# Patient Record
Sex: Female | Born: 1960 | Race: White | Hispanic: No | Marital: Married | Smoking: Never smoker
Health system: Southern US, Community
[De-identification: ages and names within clinical notes are randomized; demographics above are authoritative.]

---

## 1991-01-13 DIAGNOSIS — M25569 Pain in unspecified knee: Secondary | ICD-10-CM

## 1997-05-10 ENCOUNTER — Other Ambulatory Visit: Admission: RE | Admit: 1997-05-10 | Discharge: 1997-05-10 | Payer: Self-pay | Admitting: Obstetrics & Gynecology

## 1998-03-28 ENCOUNTER — Other Ambulatory Visit: Admission: RE | Admit: 1998-03-28 | Discharge: 1998-03-28 | Payer: Self-pay | Admitting: Obstetrics & Gynecology

## 1998-12-30 ENCOUNTER — Ambulatory Visit (HOSPITAL_COMMUNITY): Admission: RE | Admit: 1998-12-30 | Discharge: 1998-12-30 | Payer: Self-pay | Admitting: Specialist

## 1998-12-30 ENCOUNTER — Encounter: Payer: Self-pay | Admitting: Specialist

## 1999-01-22 ENCOUNTER — Ambulatory Visit (HOSPITAL_COMMUNITY): Admission: RE | Admit: 1999-01-22 | Discharge: 1999-01-22 | Payer: Self-pay | Admitting: Specialist

## 1999-01-22 ENCOUNTER — Encounter: Payer: Self-pay | Admitting: Specialist

## 1999-04-01 ENCOUNTER — Other Ambulatory Visit: Admission: RE | Admit: 1999-04-01 | Discharge: 1999-04-01 | Payer: Self-pay | Admitting: Obstetrics & Gynecology

## 1999-09-10 ENCOUNTER — Other Ambulatory Visit: Admission: RE | Admit: 1999-09-10 | Discharge: 1999-09-10 | Payer: Self-pay | Admitting: Obstetrics & Gynecology

## 1999-09-10 ENCOUNTER — Encounter (INDEPENDENT_AMBULATORY_CARE_PROVIDER_SITE_OTHER): Payer: Self-pay

## 2000-03-08 ENCOUNTER — Other Ambulatory Visit: Admission: RE | Admit: 2000-03-08 | Discharge: 2000-03-08 | Payer: Self-pay | Admitting: Obstetrics & Gynecology

## 2001-03-28 ENCOUNTER — Other Ambulatory Visit: Admission: RE | Admit: 2001-03-28 | Discharge: 2001-03-28 | Payer: Self-pay | Admitting: Obstetrics & Gynecology

## 2001-12-19 ENCOUNTER — Encounter: Payer: Self-pay | Admitting: Obstetrics & Gynecology

## 2001-12-19 ENCOUNTER — Encounter: Admission: RE | Admit: 2001-12-19 | Discharge: 2001-12-19 | Payer: Self-pay | Admitting: Obstetrics & Gynecology

## 2002-04-03 ENCOUNTER — Other Ambulatory Visit: Admission: RE | Admit: 2002-04-03 | Discharge: 2002-04-03 | Payer: Self-pay | Admitting: Obstetrics & Gynecology

## 2002-05-11 ENCOUNTER — Encounter: Payer: Self-pay | Admitting: Obstetrics & Gynecology

## 2002-05-11 ENCOUNTER — Encounter: Admission: RE | Admit: 2002-05-11 | Discharge: 2002-05-11 | Payer: Self-pay | Admitting: Obstetrics & Gynecology

## 2003-04-10 ENCOUNTER — Other Ambulatory Visit: Admission: RE | Admit: 2003-04-10 | Discharge: 2003-04-10 | Payer: Self-pay | Admitting: Obstetrics & Gynecology

## 2003-06-12 ENCOUNTER — Encounter: Admission: RE | Admit: 2003-06-12 | Discharge: 2003-06-12 | Payer: Self-pay | Admitting: Obstetrics & Gynecology

## 2004-04-11 ENCOUNTER — Other Ambulatory Visit: Admission: RE | Admit: 2004-04-11 | Discharge: 2004-04-11 | Payer: Self-pay | Admitting: Obstetrics & Gynecology

## 2004-08-18 ENCOUNTER — Encounter: Admission: RE | Admit: 2004-08-18 | Discharge: 2004-08-18 | Payer: Self-pay | Admitting: Obstetrics & Gynecology

## 2004-08-28 ENCOUNTER — Encounter: Admission: RE | Admit: 2004-08-28 | Discharge: 2004-08-28 | Payer: Self-pay | Admitting: Obstetrics & Gynecology

## 2005-10-07 ENCOUNTER — Encounter: Admission: RE | Admit: 2005-10-07 | Discharge: 2005-10-07 | Payer: Self-pay | Admitting: Obstetrics & Gynecology

## 2006-10-07 ENCOUNTER — Encounter: Admission: RE | Admit: 2006-10-07 | Discharge: 2006-10-07 | Payer: Self-pay | Admitting: Obstetrics & Gynecology

## 2007-11-29 ENCOUNTER — Encounter: Admission: RE | Admit: 2007-11-29 | Discharge: 2007-11-29 | Payer: Self-pay | Admitting: Obstetrics & Gynecology

## 2009-03-12 ENCOUNTER — Encounter: Admission: RE | Admit: 2009-03-12 | Discharge: 2009-03-12 | Payer: Self-pay | Admitting: Obstetrics & Gynecology

## 2010-02-02 ENCOUNTER — Encounter: Payer: Self-pay | Admitting: Obstetrics & Gynecology

## 2010-02-05 ENCOUNTER — Other Ambulatory Visit: Payer: Self-pay | Admitting: Obstetrics & Gynecology

## 2010-02-05 DIAGNOSIS — Z1239 Encounter for other screening for malignant neoplasm of breast: Secondary | ICD-10-CM

## 2010-03-17 ENCOUNTER — Ambulatory Visit: Payer: Self-pay

## 2010-03-25 ENCOUNTER — Ambulatory Visit: Payer: Self-pay

## 2010-04-15 ENCOUNTER — Other Ambulatory Visit: Payer: Self-pay | Admitting: Obstetrics & Gynecology

## 2010-04-15 ENCOUNTER — Other Ambulatory Visit: Payer: Self-pay | Admitting: Occupational Therapy

## 2010-04-15 ENCOUNTER — Ambulatory Visit
Admission: RE | Admit: 2010-04-15 | Discharge: 2010-04-15 | Disposition: A | Discharge status: Home / Self Care | Payer: 59 | Source: Ambulatory Visit | Attending: Obstetrics & Gynecology | Admitting: Obstetrics & Gynecology

## 2010-04-15 DIAGNOSIS — R928 Other abnormal and inconclusive findings on diagnostic imaging of breast: Secondary | ICD-10-CM

## 2010-04-15 DIAGNOSIS — Z1239 Encounter for other screening for malignant neoplasm of breast: Secondary | ICD-10-CM

## 2010-04-16 ENCOUNTER — Ambulatory Visit
Admission: RE | Admit: 2010-04-16 | Discharge: 2010-04-16 | Disposition: A | Discharge status: Home / Self Care | Payer: 59 | Source: Ambulatory Visit | Attending: Obstetrics & Gynecology | Admitting: Obstetrics & Gynecology

## 2010-04-16 DIAGNOSIS — R928 Other abnormal and inconclusive findings on diagnostic imaging of breast: Secondary | ICD-10-CM

## 2011-03-30 ENCOUNTER — Other Ambulatory Visit: Payer: Self-pay | Admitting: Obstetrics & Gynecology

## 2011-03-30 DIAGNOSIS — Z1231 Encounter for screening mammogram for malignant neoplasm of breast: Secondary | ICD-10-CM

## 2011-04-20 ENCOUNTER — Ambulatory Visit: Payer: 59

## 2011-04-29 ENCOUNTER — Ambulatory Visit
Admission: RE | Admit: 2011-04-29 | Discharge: 2011-04-29 | Disposition: A | Discharge status: Home / Self Care | Payer: 59 | Source: Ambulatory Visit | Attending: Obstetrics & Gynecology | Admitting: Obstetrics & Gynecology

## 2011-04-29 DIAGNOSIS — Z1231 Encounter for screening mammogram for malignant neoplasm of breast: Secondary | ICD-10-CM

## 2012-05-10 ENCOUNTER — Other Ambulatory Visit: Payer: Self-pay

## 2012-05-10 DIAGNOSIS — Z1231 Encounter for screening mammogram for malignant neoplasm of breast: Secondary | ICD-10-CM

## 2012-06-01 ENCOUNTER — Ambulatory Visit
Admission: RE | Admit: 2012-06-01 | Discharge: 2012-06-01 | Disposition: A | Discharge status: Home / Self Care | Payer: 59 | Source: Ambulatory Visit

## 2012-06-01 DIAGNOSIS — Z1231 Encounter for screening mammogram for malignant neoplasm of breast: Secondary | ICD-10-CM

## 2013-04-25 ENCOUNTER — Other Ambulatory Visit: Payer: Self-pay

## 2013-04-25 ENCOUNTER — Other Ambulatory Visit: Payer: Self-pay | Admitting: Obstetrics & Gynecology

## 2013-04-25 DIAGNOSIS — Z1231 Encounter for screening mammogram for malignant neoplasm of breast: Secondary | ICD-10-CM

## 2013-06-07 ENCOUNTER — Ambulatory Visit
Admission: RE | Admit: 2013-06-07 | Discharge: 2013-06-07 | Disposition: A | Discharge status: Home / Self Care | Payer: 59 | Source: Ambulatory Visit

## 2013-06-07 DIAGNOSIS — Z1231 Encounter for screening mammogram for malignant neoplasm of breast: Secondary | ICD-10-CM

## 2014-05-08 ENCOUNTER — Other Ambulatory Visit: Payer: Self-pay

## 2014-05-08 DIAGNOSIS — Z1231 Encounter for screening mammogram for malignant neoplasm of breast: Secondary | ICD-10-CM

## 2014-06-25 ENCOUNTER — Ambulatory Visit: Payer: Self-pay

## 2014-07-06 ENCOUNTER — Ambulatory Visit (INDEPENDENT_AMBULATORY_CARE_PROVIDER_SITE_OTHER): Payer: 59 | Admitting: Podiatry

## 2014-07-06 DIAGNOSIS — M2040 Other hammer toe(s) (acquired), unspecified foot: Secondary | ICD-10-CM | POA: Diagnosis not present

## 2014-07-06 DIAGNOSIS — B351 Tinea unguium: Secondary | ICD-10-CM | POA: Diagnosis not present

## 2014-07-06 MED ORDER — TERBINAFINE HCL 250 MG PO TABS: ORAL_TABLET | ORAL | Status: DC

## 2014-07-06 NOTE — Progress Notes (Signed)
   Subjective:    Patient ID: Laura Tapia, female    DOB: 07/19/1960, 54 y.o.   MRN: [id number]  HPI Pt presents with b/l thickening of nails, right great, left 4th   Review of Systems  All other systems reviewed and are negative.      Objective:   Physical Exam        Assessment & Plan:

## 2014-07-06 NOTE — Progress Notes (Signed)
Subjective:     Patient ID: Laura Tapia, female   DOB: 04-25-1960, 54 y.o.   MRN: [id number]  HPI patient presents stating I have some nails that have been thick and become ingrown and also I have a digital deformity on my left foot that is becoming more prominent as time goes on   Review of Systems  All other systems reviewed and are negative.      Objective:   Physical Exam  Constitutional: She is oriented to person, place, and time.  Cardiovascular: Intact distal pulses.   Musculoskeletal: Normal range of motion.  Neurological: She is oriented to person, place, and time.  Skin: Skin is warm.  Nursing note and vitals reviewed.  neurovascular status intact muscle strength adequate with range of motion within normal limits. Patient is noted to have discoloration in thickness of the hallux nails right over left and the fourth nail left with change noted especially the fourth nail left. Is noted to have elevation of the lesser digits left over right with moderate rigid contracture of the second MPJ     Assessment:     Combination of organic fungal infection along with localized trauma and possible opportunistic fungal infection of 3 toenails. Also noted to have hammertoe deformity second left    Plan:     H&P and conditions reviewed with Marchelle Folks. Today were to go ahead start her on pulse Lamisil treatment and topical formulas 3 with consideration some day for nail removal if symptoms were to worsen or possible laser treatment. Educated her on all these different elements of this condition and reappoint to recheck

## 2014-07-11 ENCOUNTER — Ambulatory Visit
Admission: RE | Admit: 2014-07-11 | Discharge: 2014-07-11 | Disposition: A | Discharge status: Home / Self Care | Payer: 59 | Source: Ambulatory Visit

## 2014-07-11 DIAGNOSIS — Z1231 Encounter for screening mammogram for malignant neoplasm of breast: Secondary | ICD-10-CM

## 2015-06-05 ENCOUNTER — Other Ambulatory Visit: Payer: Self-pay

## 2015-06-05 DIAGNOSIS — Z1231 Encounter for screening mammogram for malignant neoplasm of breast: Secondary | ICD-10-CM

## 2015-07-22 ENCOUNTER — Other Ambulatory Visit: Payer: Self-pay | Admitting: Internal Medicine

## 2015-07-22 ENCOUNTER — Ambulatory Visit
Admission: RE | Admit: 2015-07-22 | Discharge: 2015-07-22 | Disposition: A | Discharge status: Home / Self Care | Payer: 59 | Source: Ambulatory Visit

## 2015-07-22 DIAGNOSIS — Z1231 Encounter for screening mammogram for malignant neoplasm of breast: Secondary | ICD-10-CM

## 2015-07-24 ENCOUNTER — Other Ambulatory Visit: Payer: Self-pay | Admitting: Internal Medicine

## 2015-07-24 DIAGNOSIS — R928 Other abnormal and inconclusive findings on diagnostic imaging of breast: Secondary | ICD-10-CM

## 2015-07-30 ENCOUNTER — Ambulatory Visit
Admission: RE | Admit: 2015-07-30 | Discharge: 2015-07-30 | Disposition: A | Discharge status: Home / Self Care | Payer: 59 | Source: Ambulatory Visit | Attending: Internal Medicine | Admitting: Internal Medicine

## 2015-07-30 DIAGNOSIS — R928 Other abnormal and inconclusive findings on diagnostic imaging of breast: Secondary | ICD-10-CM

## 2016-02-04 DIAGNOSIS — M459 Ankylosing spondylitis of unspecified sites in spine: Secondary | ICD-10-CM | POA: Diagnosis not present

## 2016-02-04 DIAGNOSIS — M150 Primary generalized (osteo)arthritis: Secondary | ICD-10-CM | POA: Diagnosis not present

## 2016-02-04 DIAGNOSIS — M2550 Pain in unspecified joint: Secondary | ICD-10-CM | POA: Diagnosis not present

## 2016-02-11 DIAGNOSIS — M47816 Spondylosis without myelopathy or radiculopathy, lumbar region: Secondary | ICD-10-CM | POA: Diagnosis not present

## 2016-02-18 DIAGNOSIS — M6281 Muscle weakness (generalized): Secondary | ICD-10-CM | POA: Diagnosis not present

## 2016-02-18 DIAGNOSIS — M62838 Other muscle spasm: Secondary | ICD-10-CM | POA: Diagnosis not present

## 2016-02-18 DIAGNOSIS — M5430 Sciatica, unspecified side: Secondary | ICD-10-CM | POA: Diagnosis not present

## 2016-02-18 DIAGNOSIS — M5136 Other intervertebral disc degeneration, lumbar region: Secondary | ICD-10-CM | POA: Diagnosis not present

## 2016-02-25 DIAGNOSIS — M6281 Muscle weakness (generalized): Secondary | ICD-10-CM | POA: Diagnosis not present

## 2016-02-25 DIAGNOSIS — M62838 Other muscle spasm: Secondary | ICD-10-CM | POA: Diagnosis not present

## 2016-02-25 DIAGNOSIS — M5430 Sciatica, unspecified side: Secondary | ICD-10-CM | POA: Diagnosis not present

## 2016-03-04 DIAGNOSIS — M62838 Other muscle spasm: Secondary | ICD-10-CM | POA: Diagnosis not present

## 2016-03-04 DIAGNOSIS — M2550 Pain in unspecified joint: Secondary | ICD-10-CM | POA: Diagnosis not present

## 2016-03-04 DIAGNOSIS — M6281 Muscle weakness (generalized): Secondary | ICD-10-CM | POA: Diagnosis not present

## 2016-03-12 DIAGNOSIS — M459 Ankylosing spondylitis of unspecified sites in spine: Secondary | ICD-10-CM | POA: Diagnosis not present

## 2016-03-12 DIAGNOSIS — M62838 Other muscle spasm: Secondary | ICD-10-CM | POA: Diagnosis not present

## 2016-03-12 DIAGNOSIS — M6281 Muscle weakness (generalized): Secondary | ICD-10-CM | POA: Diagnosis not present

## 2016-03-17 DIAGNOSIS — M6281 Muscle weakness (generalized): Secondary | ICD-10-CM | POA: Diagnosis not present

## 2016-03-17 DIAGNOSIS — M2550 Pain in unspecified joint: Secondary | ICD-10-CM | POA: Diagnosis not present

## 2016-03-17 DIAGNOSIS — M62838 Other muscle spasm: Secondary | ICD-10-CM | POA: Diagnosis not present

## 2016-03-24 DIAGNOSIS — M533 Sacrococcygeal disorders, not elsewhere classified: Secondary | ICD-10-CM | POA: Diagnosis not present

## 2016-03-31 DIAGNOSIS — E039 Hypothyroidism, unspecified: Secondary | ICD-10-CM | POA: Diagnosis not present

## 2016-04-03 DIAGNOSIS — M76822 Posterior tibial tendinitis, left leg: Secondary | ICD-10-CM | POA: Diagnosis not present

## 2016-04-03 DIAGNOSIS — M25572 Pain in left ankle and joints of left foot: Secondary | ICD-10-CM | POA: Diagnosis not present

## 2016-04-07 DIAGNOSIS — L814 Other melanin hyperpigmentation: Secondary | ICD-10-CM | POA: Diagnosis not present

## 2016-04-14 DIAGNOSIS — M459 Ankylosing spondylitis of unspecified sites in spine: Secondary | ICD-10-CM | POA: Diagnosis not present

## 2016-04-14 DIAGNOSIS — M150 Primary generalized (osteo)arthritis: Secondary | ICD-10-CM | POA: Diagnosis not present

## 2016-04-14 DIAGNOSIS — M2550 Pain in unspecified joint: Secondary | ICD-10-CM | POA: Diagnosis not present

## 2016-04-28 DIAGNOSIS — M76822 Posterior tibial tendinitis, left leg: Secondary | ICD-10-CM | POA: Diagnosis not present

## 2016-05-11 DIAGNOSIS — M47816 Spondylosis without myelopathy or radiculopathy, lumbar region: Secondary | ICD-10-CM | POA: Diagnosis not present

## 2016-05-20 DIAGNOSIS — M76822 Posterior tibial tendinitis, left leg: Secondary | ICD-10-CM | POA: Diagnosis not present

## 2016-05-26 DIAGNOSIS — Z008 Encounter for other general examination: Secondary | ICD-10-CM | POA: Diagnosis not present

## 2016-05-26 DIAGNOSIS — Z01419 Encounter for gynecological examination (general) (routine) without abnormal findings: Secondary | ICD-10-CM | POA: Diagnosis not present

## 2016-05-26 DIAGNOSIS — M5416 Radiculopathy, lumbar region: Secondary | ICD-10-CM | POA: Diagnosis not present

## 2016-05-28 ENCOUNTER — Telehealth (HOSPITAL_COMMUNITY): Payer: Self-pay

## 2016-05-28 NOTE — Telephone Encounter (Signed)
Pt is not available she has to work on this date

## 2016-06-09 ENCOUNTER — Ambulatory Visit (HOSPITAL_COMMUNITY): Payer: 59 | Attending: Orthopedic Surgery | Admitting: Physical Therapy

## 2016-06-09 ENCOUNTER — Encounter (HOSPITAL_COMMUNITY): Payer: Self-pay | Admitting: Physical Therapy

## 2016-06-09 DIAGNOSIS — R2689 Other abnormalities of gait and mobility: Secondary | ICD-10-CM | POA: Diagnosis not present

## 2016-06-09 DIAGNOSIS — M79672 Pain in left foot: Secondary | ICD-10-CM | POA: Insufficient documentation

## 2016-06-09 DIAGNOSIS — M6281 Muscle weakness (generalized): Secondary | ICD-10-CM | POA: Insufficient documentation

## 2016-06-09 NOTE — Therapy (Signed)
[place] [facility] [place] [address] [place], [place], [address] Phone: [phone]   Fax:  [phone]  Physical Therapy Evaluation  Patient Details  Name: Laura Tapia MRN: [id number] Date of Birth: 1960/01/18 Referring Provider: Toni Arthurs, MD   Encounter Date: 06/09/2016      PT End of Session - 06/09/16 1220    Visit Number 1   Number of Visits 13   Date for PT Re-Evaluation 06/30/16   Authorization Type UHC    Authorization Time Period 06/09/16 to 07/21/16   PT Start Time 0817   PT Stop Time 0858   PT Time Calculation (min) 41 min   Activity Tolerance Patient tolerated treatment well;No increased pain   Behavior During Therapy [facility] for tasks assessed/performed      History reviewed. No pertinent past medical history.  History reviewed. No pertinent surgical history.  There were no vitals filed for this visit.       Subjective Assessment - 06/09/16 0821    Subjective Pt reports pain began last October out of nowhere. She works on her feet 9 hours a day. Over the past several months she was trying various conservative treatments such as injections and orthotics. She is currently wearing an ankle brace which she thinks might be helping some.    Pertinent History Being treated for RA but not any treatment    Limitations Walking;Standing   How long can you sit comfortably? unlimited    How long can you stand comfortably? couple of hours    How long can you walk comfortably? couple of hours    Diagnostic tests MRI and foot X-ray negative    Patient Stated Goals improve foot pain    Currently in Pain? No/denies   Pain Location Foot   Pain Orientation Medial   Pain Descriptors / Indicators Aching;Throbbing   Pain Type Chronic pain   Pain Radiating Towards none    Pain Onset More than a month ago   Pain Frequency Intermittent   Aggravating Factors  being up on her feet for long; pointing toes    Pain Relieving Factors compression,  ice and being off of her feet             [facility] PT Assessment - 06/09/16 0001      Assessment   Medical Diagnosis posterior tibial tendonitis    Referring Provider Toni Arthurs, MD    Onset Date/Surgical Date --  Oct 2017   Next MD Visit ~3 months    Prior Therapy none      Balance Screen   Has the patient fallen in the past 6 months No   Has the patient had a decrease in activity level because of a fear of falling?  No   Is the patient reluctant to leave their home because of a fear of falling?  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      Cognition   Overall Cognitive Status Within Functional Limits for tasks assessed     Observation/Other Assessments   Focus on Therapeutic Outcomes (FOTO)  27% limited      ROM / Strength   AROM / PROM / Strength AROM;Strength     AROM   AROM Assessment Site Ankle   Right/Left Ankle Right;Left   Right Ankle Dorsiflexion 2  Knee flexed 10 deg   Right Ankle Plantar Flexion 54   Right Ankle Inversion 12   Left Ankle Dorsiflexion 8  knee flexed 15 deg    Left Ankle Plantar Flexion 52   Left Ankle Inversion 12     Strength   Strength Assessment Site Hip;Knee;Ankle   Right/Left Hip Right;Left   Right Hip Flexion 5/5   Right Hip Extension 4/5   Right Hip ABduction 4-/5   Left Hip Flexion 5/5   Left Hip Extension 4/5   Left Hip ABduction 4/5   Right/Left Knee Right;Left   Right Knee Flexion 4+/5   Right Knee Extension 5/5   Left Knee Flexion 4+/5   Left Knee Extension 5/5   Right/Left Ankle Right;Left   Right Ankle Dorsiflexion 5/5   Right Ankle Plantar Flexion 5/5   Right Ankle Inversion 5/5   Right Ankle Eversion 5/5   Left Ankle Dorsiflexion 5/5   Left Ankle Plantar Flexion --  (+) pain after 9 reps   Left Ankle Inversion 4/5   Left Ankle Eversion 5/5     Palpation   Palpation  comment tenderness along medial navicular region along the posterior tibial tendon      Ambulation/Gait   Gait Comments (+) pronation on the Lt, decreased push      High Level Balance   High Level Balance Comments SLS: Rt 20+ sec, Lt 3 sec             Objective measurements completed on examination: See above findings.          [facility] Adult PT Treatment/Exercise - 06/09/16 0001      Exercises   Exercises Ankle     Ankle Exercises: Seated   Heel Raises 5 reps;3 seconds   Heel Raises Limitations HEP demo    Other Seated Ankle Exercises Great toe extension 5 x3 sec hold for HEP demo    Other Seated Ankle Exercises gross toe abd 5x3 sec for HEP demo      Ankle Exercises: Supine   T-Band Lt ankle PF with red TB x20 reps, increased to green TB x5 reps for HEP demo                 PT Education - 06/09/16 1219    Education provided Yes   Education Details typical causes of PTTD; eval findings/POC; implemented HEP    Person(s) Educated Patient   Methods Explanation;Demonstration;Verbal cues;Handout   Comprehension Returned demonstration;Verbalized understanding          PT Short Term Goals - 06/09/16 1228      PT SHORT TERM GOAL #1   Title Pt will demo consistency and independence with her HEP to improve ankle strength and mobility.    Time 2   Period Weeks   Status New           PT Long Term Goals - 06/09/16 1229      PT LONG TERM GOAL #1   Title Pt will demo ankle strength to 5/5 MMT which will improve her safety with daily activity.    Time 6   Period Weeks   Status New     PT LONG TERM GOAL #2   Title Pt will maintain single leg balance on the Lt for atleast 15 sec, 2/3 trials, to decrease her risk of falls and injury at work.    Time 6   Period Weeks   Status New     PT LONG TERM GOAL #3  Title Pt will demo improved ankle strength and pain evident by her ability to ambulate atleast 225 ft with proper heel strike and push off pattern for  increased efficiency with being on her feet all day at work.    Time 6   Period Weeks   Status New     PT LONG TERM GOAL #4   Title Pt will report atleast 50% improvement in her mobility and activity tolerance, to increase her performance at work during the day.     Time 6   Period Weeks   Status New                Plan - 06/09/16 1255    Clinical Impression Statement Pt is a pleasant 55yo F referred to OPPT for evaluation and management of Lt posterior tibial tendonitis ongoing for several months now. She currently works 9 hour days and is on her feet most of the time. She demonstrates Lt ankle weakness, hip weakness and tenderness along the medial aspect of the foot consistent with MD referral. She also demonstrates foot pronation on the Lt greater than the Rt with static standing and during ambulation and poor proprioception on the Lt during single leg stance. She would benefit from skilled PT  to address her limitations and facilitate full return to work and home activity without difficulty. Eval findings were reviewed with the pt and she verbalized understanding and agreement with proposed PT POC/frequency.    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 and improve her function    Clinical Presentation Stable   Clinical Decision Making Moderate   Rehab Potential Good   PT Frequency 2x / week   PT Duration 6 weeks   PT Treatment/Interventions ADLs/Self Care Home Management;Cryotherapy;Moist Heat;Gait training;Neuromuscular re-education;Balance training;Therapeutic exercise;Therapeutic activities;Functional mobility training;Stair training;Patient/family education;Orthotic Fit/Training;Manual techniques;Dry needling;Passive range of motion;Taping   PT Next Visit Plan ankle PF strength with TB, seated heel raises    PT Home Exercise Plan seated arch lifts, great toe extension, gross toe abduction,  ankle PF with green TB   Recommended Other Services none    Consulted and Agree with Plan of Care Patient      Patient will benefit from skilled therapeutic intervention in order to improve the following deficits and impairments:  Abnormal gait, Decreased activity tolerance, Decreased balance, Difficulty walking, Impaired flexibility, Increased muscle spasms, Postural dysfunction, Pain, Decreased strength, Decreased range of motion  Visit Diagnosis: Pain in left foot  Muscle weakness (generalized)  Other abnormalities of gait and mobility     Problem List There are no active problems to display for this patient.  5:05 PM,06/09/16 Marylyn Ishihara PT, DPT [facility] [phone]  [facility] [address] [place], [place], [address] Phone: [phone]   Fax:  [phone]  Name: MAEZIE JUSTIN MRN: [id number] Date of Birth: 1960/06/15

## 2016-06-11 ENCOUNTER — Encounter (HOSPITAL_COMMUNITY): Payer: 59

## 2016-06-12 ENCOUNTER — Other Ambulatory Visit: Payer: Self-pay | Admitting: Internal Medicine

## 2016-06-12 DIAGNOSIS — Z1231 Encounter for screening mammogram for malignant neoplasm of breast: Secondary | ICD-10-CM

## 2016-06-16 ENCOUNTER — Ambulatory Visit (HOSPITAL_COMMUNITY): Payer: 59 | Attending: Orthopedic Surgery

## 2016-06-16 DIAGNOSIS — R2689 Other abnormalities of gait and mobility: Secondary | ICD-10-CM | POA: Diagnosis not present

## 2016-06-16 DIAGNOSIS — M6281 Muscle weakness (generalized): Secondary | ICD-10-CM | POA: Diagnosis not present

## 2016-06-16 DIAGNOSIS — M79672 Pain in left foot: Secondary | ICD-10-CM | POA: Insufficient documentation

## 2016-06-16 NOTE — Therapy (Signed)
[facility] [address] [place], [place], [address] Phone: [phone]   Fax:  [phone]  Physical Therapy Treatment  Patient Details  Name: Laura Tapia MRN: [id number] Date of Birth: [birth year] Referring Provider: Toni ArthursJohn Hewitt, MD   Encounter Date: 06/16/2016      PT End of Session - 06/16/16 1654    Visit Number 2   Number of Visits 13   Date for PT Re-Evaluation 06/30/16   Authorization Type UHC    Authorization Time Period 06/09/16 to 07/21/16   PT Start Time 1650   PT Stop Time 1728   PT Time Calculation (min) 38 min   Activity Tolerance Patient tolerated treatment well;No increased pain   [facility] tasks assessed/performed      No past medical history on file.  No past surgical history on file.  There were no vitals filed for this visit.      Subjective Assessment - 06/16/16 1644    Subjective Pt reports she continues to have constant sore, achey, throbbing, burning pain in Lt foot.  Reports complaince with HEP daily and no questions concerning.     Pertinent History Being treated for RA but not any treatment    Patient Stated Goals improve foot pain    Currently in Pain? Yes   Pain Location Foot   Pain Orientation Left;Medial   Pain Descriptors / Indicators Burning;Throbbing;Aching;Sore   Pain Type Chronic pain   Pain Radiating Towards none   Pain Onset More than a month ago   Pain Frequency Constant   Aggravating Factors  being on feet too long, pointing toes   Pain Relieving Factors compression, ice and being off of her feet                         [facility] Adult PT Treatment/Exercise - 06/16/16 0001      Manual Therapy   Manual Therapy Soft tissue mobilization   Manual therapy comments Manual complete separate rest of tx   Soft tissue mobilization Prone STM to gastroc/soleus complex     Ankle Exercises: Seated   Towel Crunch 2 reps   Heel Raises 10 reps  2 sets   Toe Raise 10 reps  increased pain   BAPS Sitting;Level 3;10 reps  all directions 10x; CW/CCW 5x each   Other Seated Ankle Exercises Great toe extension 10 x3 sec hold for HEP demo    Other Seated Ankle Exercises gross toe abd 10x3 sec for HEP demo      Ankle Exercises: Supine   T-Band Lt ankle PF GTB 20x                PT Education - 06/16/16 1703    Education provided Yes   Education Details Reviewed goals, assured compliance iwth HEP and copy of eval given to pt.   Person(s) Educated Patient   Methods Explanation;Demonstration;Handout   Comprehension Verbalized understanding;Returned demonstration          PT Short Term Goals - 06/09/16 1228      PT SHORT TERM GOAL #1   Title Pt will demo consistency and independence with her HEP to improve ankle strength and mobility.    Time 2   Period Weeks   Status New           PT Long Term Goals - 06/09/16 1229      PT LONG TERM GOAL #1   Title Pt  will demo ankle strength to 5/5 MMT which will improve her safety with daily activity.    Time 6   Period Weeks   Status New     PT LONG TERM GOAL #2   Title Pt will maintain single leg balance on the Lt for atleast 15 sec, 2/3 trials, to decrease her risk of falls and injury at work.    Time 6   Period Weeks   Status New     PT LONG TERM GOAL #3   Title Pt will demo improved ankle strength and pain evident by her ability to ambulate atleast 225 ft with proper heel strike and push off pattern for increased efficiency with being on her feet all day at work.    Time 6   Period Weeks   Status New     PT LONG TERM GOAL #4   Title Pt will report atleast 50% improvement in her mobility and activity tolerance, to increase her performance at work during the day.     Time 6   Period Weeks   Status New               Plan - 06/16/16 1719    Clinical Impression Statement Reviewed goals, assured compliance and proper technqiue with HEP and copy of eval given to pt.   Session focus on improving intrinsic/ankle strengthening and mobility to assist with pain control and improve mechanics.  Pt able to demonstrate appropriate form with therex with minimal cueing required.  EOS with manual to address tight musculature in gastroc/soleus complex.  Pt reports pain reduced to 4/10 at EOS.     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  Add gastroc/soleus/achilles tendon stretches next session.   PT Home Exercise Plan seated arch lifts, great toe extension, gross toe abduction, ankle PF with green TB      Patient will benefit from skilled therapeutic intervention in order to improve the following deficits and impairments:  Abnormal gait, Decreased activity tolerance, Decreased balance, Difficulty walking, Impaired flexibility, Increased muscle spasms, Postural dysfunction, Pain, Decreased strength, Decreased range of motion  Visit Diagnosis: Pain in left foot  Muscle weakness (generalized)  Other abnormalities of gait and mobility     Problem List There are no active problems to display for this patient.  [address], LPTA; CBIS [phone]  Juel Burrow 06/16/2016, 6:13 PM  [place] [facility] [address] [place], [place], [address] Phone: [phone]   Fax:  [phone]  Name: Laura Tapia MRN: [id number] Date of Birth:  10-24-1960

## 2016-06-18 ENCOUNTER — Ambulatory Visit (HOSPITAL_COMMUNITY): Payer: 59

## 2016-06-18 DIAGNOSIS — M79672 Pain in left foot: Secondary | ICD-10-CM | POA: Diagnosis not present

## 2016-06-18 DIAGNOSIS — M6281 Muscle weakness (generalized): Secondary | ICD-10-CM

## 2016-06-18 DIAGNOSIS — R2689 Other abnormalities of gait and mobility: Secondary | ICD-10-CM

## 2016-06-18 NOTE — Therapy (Signed)
[facility] [address] [place], [place], [address] Phone: [phone]   Fax:  [phone]  Physical Therapy Treatment  Patient Details  Name: Laura Tapia MRN: [id number] Date of Birth: [birth year] Referring Provider: Toni Arthurs, MD   Encounter Date: 06/18/2016      PT End of Session - 06/18/16 1745    Visit Number 3   Number of Visits 13   Date for PT Re-Evaluation 06/30/16   Authorization Type UHC    Authorization Time Period 06/09/16 to 07/21/16   PT Start Time 1740   PT Stop Time 1818   PT Time Calculation (min) 38 min   Activity Tolerance Patient tolerated treatment well;No increased pain  pain scale same, increased burning arch following CKC   Behavior During Therapy [facility] for tasks assessed/performed      No past medical history on file.  No past surgical history on file.  There were no vitals filed for this visit.      Subjective Assessment - 06/18/16 1743    Subjective Pt stated she continues to have throbbing burning pain in Lt foot, pain scale 4/10.  Reports compliance wiht HEP.     Pertinent History Being treated for RA but not any treatment    Patient Stated Goals improve foot pain    Currently in Pain? Yes   Pain Location Foot   Pain Orientation Left;Medial   Pain Descriptors / Indicators Throbbing;Burning;Aching   Pain Type Chronic pain   Pain Onset More than a month ago   Pain Frequency Constant   Aggravating Factors  being on feet too long, pointing toes   Pain Relieving Factors compression, ice and being off of her feet                         [facility] Adult PT Treatment/Exercise - 06/18/16 0001      Manual Therapy   Manual Therapy Soft tissue mobilization   Manual therapy comments Manual complete separate rest of tx   Soft tissue mobilization Prone STM to gastroc/soleus complex and arch     Ankle Exercises: Seated   Toe Raise 20 reps  able to complete pain free   Other Seated  Ankle Exercises arch 5" holds 20x     Ankle Exercises: Stretches   Gastroc Stretch 2 reps;30 seconds  against wall     Ankle Exercises: Standing   Heel Raises 20 reps   Toe Raise 20 reps   Other Standing Ankle Exercises arch forming 10x5"   Other Standing Ankle Exercises Prone position:  inversion/eversion manual resistance                  PT Short Term Goals - 06/09/16 1228      PT SHORT TERM GOAL #1   Title Pt will demo consistency and independence with her HEP to improve ankle strength and mobility.    Time 2   Period Weeks   Status New           PT Long Term Goals - 06/09/16 1229      PT LONG TERM GOAL #1   Title Pt will demo ankle strength to 5/5 MMT which will improve her safety with daily activity.    Time 6   Period Weeks   Status New     PT LONG TERM GOAL #2   Title Pt will maintain single leg balance on the Lt for atleast 15 sec, 2/3  trials, to decrease her risk of falls and injury at work.    Time 6   Period Weeks   Status New     PT LONG TERM GOAL #3   Title Pt will demo improved ankle strength and pain evident by her ability to ambulate atleast 225 ft with proper heel strike and push off pattern for increased efficiency with being on her feet all day at work.    Time 6   Period Weeks   Status New     PT LONG TERM GOAL #4   Title Pt will report atleast 50% improvement in her mobility and activity tolerance, to increase her performance at work during the day.     Time 6   Period Weeks   Status New               Plan - 06/18/16 1826    Clinical Impression Statement Began session with manual to address tightness in gastroc/soleus complex as well as the arch.  Added stretches to improve ankle mobility.  Progressed to CKC for intrinsic/ankle strengthening, pt with increased difficulty forming arch with weight bearing.  Improved mechanics noted with seated arch.  Pt able to complete seated and standing toe raises with no reports of pain  this session.  EOS reports of pain scale same, does report increased burning.     Rehab Potential Good   PT Frequency 2x / week   PT Duration 6 weeks   PT Treatment/Interventions ADLs/Self Care Home Management;Cryotherapy;Moist Heat;Gait training;Neuromuscular re-education;Balance training;Therapeutic exercise;Therapeutic activities;Functional mobility training;Stair training;Patient/family education;Orthotic Fit/Training;Manual techniques;Dry needling;Passive range of motion;Taping   PT Next Visit Plan Continue with manual and CKC exercises.  Increase focus on arch in standing.     PT Home Exercise Plan seated arch lifts, great toe extension, gross toe abduction, ankle PF with green TB      Patient will benefit from skilled therapeutic intervention in order to improve the following deficits and impairments:  Abnormal gait, Decreased activity tolerance, Decreased balance, Difficulty walking, Impaired flexibility, Increased muscle spasms, Postural dysfunction, Pain, Decreased strength, Decreased range of motion  Visit Diagnosis: Pain in left foot  Muscle weakness (generalized)  Other abnormalities of gait and mobility     Problem List There are no active problems to display for this patient.  [address]Casey Cockerham, LPTA; CBIS [phone]  Juel BurrowCockerham, Casey Jo 06/18/2016, 6:34 PM  [facility] [address] [place], [place], [address] Phone: [phone]   Fax:  [phone]  Name: Alphonsa Overallmanda L Greis MRN: [id number] Date of Birth: August 07, 1960

## 2016-06-22 DIAGNOSIS — M7750 Other enthesopathy of unspecified foot: Secondary | ICD-10-CM | POA: Diagnosis not present

## 2016-06-22 DIAGNOSIS — G4700 Insomnia, unspecified: Secondary | ICD-10-CM | POA: Diagnosis not present

## 2016-06-23 ENCOUNTER — Ambulatory Visit (HOSPITAL_COMMUNITY): Payer: 59 | Admitting: Physical Therapy

## 2016-06-23 DIAGNOSIS — M6281 Muscle weakness (generalized): Secondary | ICD-10-CM

## 2016-06-23 DIAGNOSIS — M79672 Pain in left foot: Secondary | ICD-10-CM

## 2016-06-23 DIAGNOSIS — R2689 Other abnormalities of gait and mobility: Secondary | ICD-10-CM

## 2016-06-23 NOTE — Therapy (Signed)
[place] [facility] [address] [place], [place], [address] Phone: [phone]   Fax:  [phone]  Physical Therapy Treatment  Patient Details  Name: Alphonsa Overallmanda L Nazaryan MRN: [id number] Date of Birth: 1960-11-22 Referring Provider: Toni ArthursJohn Hewitt, MD   Encounter Date: 06/23/2016      PT End of Session - 06/23/16 0820    Visit Number 4   Number of Visits 13   Date for PT Re-Evaluation 06/30/16   Authorization Type UHC    Authorization Time Period 06/09/16 to 07/21/16   PT Start Time 0732   PT Stop Time 0816   PT Time Calculation (min) 44 min   Activity Tolerance Patient tolerated treatment well;No increased pain  pain scale same, increased burning arch following CK[facility]   Behavior During Therapy [facility] for tasks assessed/performed      No past medical history on file.  No past surgical history on file.  There were no vitals filed for this visit.      Subjective Assessment - 06/23/16 0733    Subjective Pt reports that things are going well. She has no pain currently, and states that she has been consistent with her HEP.    Pertinent History Being treated for RA but not any treatment    Patient Stated Goals improve foot pain    Currently in Pain? No/denies   Pain Onset More than a month ago                         [facility]/Exercise - 06/23/16 0001      Manual Therapy   Manual Therapy Soft tissue mobilization;Joint mobilization   Manual therapy comments Manual complete separate rest of tx   Joint Mobilization Grade III/IV calcaneal inversion mobilization    Soft tissue mobilization STM Lt abductor hallicus; plantar fascia      Ankle Exercises: Seated   BAPS Sitting  x2 min each: CW/CCW   Other Seated Ankle Exercises ankle inv/ever with green TB x5 reps for HEP demo      Ankle Exercises: Standing   Heel Raises 20 reps;Other (comment)  on foam    Toe Raise 20 reps;Other (comment)  on foam    Other Standing  Ankle Exercises NBOS on foam with trunk rotation Lt/Rt x10 reps      Ankle Exercises: Stretches   Other Stretch Lt calcaneal inversion stretch x15 sec hold                 PT Education - 06/23/16 0818    Education provided Yes   Education Details implications for manual techniques; updated and reviewed HEP; importance of improving ankle/foot mobility and soft tissue restrictions to decrease pain; possible implications for dry needling to address foot mobility and muscle tension    Person(s) Educated Patient   Methods Demonstration;Explanation;Verbal cues;Handout   Comprehension Returned demonstration;Verbalized understanding          PT Short Term Goals - 06/09/16 1228      PT SHORT TERM GOAL #1   Title Pt will demo consistency and independence with her HEP to improve ankle strength and mobility.    Time 2   Period Weeks   Status New           PT Long Term Goals - 06/09/16 1229      PT LONG TERM GOAL #1   Title Pt will demo ankle strength to 5/5 MMT which will improve her safety with  daily activity.    Time 6   Period Weeks   Status New     PT LONG TERM GOAL #2   Title Pt will maintain single leg balance on the Lt for atleast 15 sec, 2/3 trials, to decrease her risk of falls and injury at work.    Time 6   Period Weeks   Status New     PT LONG TERM GOAL #3   Title Pt will demo improved ankle strength and pain evident by her ability to ambulate atleast 225 ft with proper heel strike and push off pattern for increased efficiency with being on her feet all day at work.    Time 6   Period Weeks   Status New     PT LONG TERM GOAL #4   Title Pt will report atleast 50% improvement in her mobility and activity tolerance, to increase her performance at work during the day.     Time 6   Period Weeks   Status New               Plan - 06/23/16 0901    Clinical Impression Statement Continued this session with therex to address intrinsic foot and ankle  strength, able to progress to more standing therex this session without significant increase in pt's symptoms. Pt appears to be making progress towards her goals evident by her ability to complete several new exercises this visit. She does continue to have tenderness with palpation along abductor hallicus and therapist spent some time addressing this with manual techniques. Pt reporting no increase in her pain by the end of the session. Updated HEP and pt demonstrated good understanding. Will continue with current POC.   Rehab Potential Good   PT Frequency 2x / week   PT Duration 6 weeks   PT Treatment/Interventions ADLs/Self Care Home Management;Cryotherapy;Moist Heat;Gait training;Neuromuscular re-education;Balance training;Therapeutic exercise;Therapeutic activities;Functional mobility training;Stair training;Patient/family education;Orthotic Fit/Training;Manual techniques;Dry needling;Passive range of motion;Taping   PT Next Visit Plan STM abd hallicus/intrinsic foot musculature/medial tibia (flexor hallicus region); gastroc strengthening; static balance    PT Home Exercise Plan inversion stretch, inv/ever with green TB/SLS/BLE standing heel raises    Consulted and Agree with Plan of Care Patient      Patient will benefit from skilled therapeutic intervention in order to improve the following deficits and impairments:  Abnormal gait, Decreased activity tolerance, Decreased balance, Difficulty walking, Impaired flexibility, Increased muscle spasms, Postural dysfunction, Pain, Decreased strength, Decreased range of motion  Visit Diagnosis: Pain in left foot  Muscle weakness (generalized)  Other abnormalities of gait and mobility     Problem List There are no active problems to display for this patient.   9:10 AM,06/23/16 Marylyn Ishihara PT, DPT [facility] [phone]  [facility] [address] [place], [place],  [address] Phone: [phone]   Fax:  [phone]  Name: SHONTELL PROSSER MRN: [id number] Date of Birth: 1960/06/11

## 2016-06-30 ENCOUNTER — Ambulatory Visit (HOSPITAL_COMMUNITY): Payer: 59 | Admitting: Physical Therapy

## 2016-06-30 DIAGNOSIS — M6281 Muscle weakness (generalized): Secondary | ICD-10-CM

## 2016-06-30 DIAGNOSIS — M79672 Pain in left foot: Secondary | ICD-10-CM

## 2016-06-30 DIAGNOSIS — M5136 Other intervertebral disc degeneration, lumbar region: Secondary | ICD-10-CM | POA: Diagnosis not present

## 2016-06-30 DIAGNOSIS — M5416 Radiculopathy, lumbar region: Secondary | ICD-10-CM | POA: Diagnosis not present

## 2016-06-30 DIAGNOSIS — R2689 Other abnormalities of gait and mobility: Secondary | ICD-10-CM

## 2016-06-30 NOTE — Therapy (Signed)
[place] [facility] [address] [place], [place], [address] Phone: [phone]   Fax:  [phone]  Physical Therapy Treatment  Patient Details  Name: Laura Tapia MRN: [id number] Date of Birth: 1960/08/22 Referring Provider: Toni Arthurs, MD   Encounter Date: 06/30/2016      PT End of Session - 06/30/16 0818    Visit Number 5   Number of Visits 13   Date for PT Re-Evaluation 06/30/16   Authorization Type UHC    Authorization Time Period 06/09/16 to 07/21/16   PT Start Time 0731   PT Stop Time 0814   PT Time Calculation (min) 43 min   Activity Tolerance Patient tolerated treatment well;No increased pain   Behavior During Therapy [facility] for tasks assessed/performed      No past medical history on file.  No past surgical history on file.  There were no vitals filed for this visit.      Subjective Assessment - 06/30/16 0736    Subjective Pt reports that she thinks things are improving. She has some issues with one of her exercises that she would like to review. She has not been wearing her brace lately.    Pertinent History Being treated for RA but not any treatment    Patient Stated Goals improve foot pain    Currently in Pain? No/denies   Pain Onset More than a month ago                         [facility] Adult PT Treatment/Exercise - 06/30/16 0001      Manual Therapy   Manual therapy comments Manual complete separate rest of tx   Joint Mobilization Grade III/IV calcaneal inversion mobilization; Grade III/IV subtalar distraction mobilization   Soft tissue mobilization STM Lt abductor hallicus; plantar fascia; peroneals and flexor halicus     Ankle Exercises: Supine   Other Supine Ankle Exercises bridge with feet on dyna disc, x10 reps      Ankle Exercises: Standing   Heel Raises 20 reps;Other (comment)  on foam    Toe Raise 20 reps;Other (comment)  on foam    Other Standing Ankle Exercises arch lifts x20 reps    Other Standing Ankle Exercises forward/backward weight shift with arch squeeze x20 reps with each LE forward; ambulating with arch/glute squeeze x4 RT     Ankle Exercises: Seated   Other Seated Ankle Exercises Lt ankle inv/ever with blue TB x20 reps                 PT Education - 06/30/16 0818    Education provided Yes   Education Details reviewed technique with HEP and made updates; importance of increasing intrinsic foot muscle activation and strength    Person(s) Educated Patient   Methods Explanation;Demonstration;Verbal cues;Handout   Comprehension Verbalized understanding;Returned demonstration          PT Short Term Goals - 06/09/16 1228      PT SHORT TERM GOAL #1   Title Pt will demo consistency and independence with her HEP to improve ankle strength and mobility.    Time 2   Period Weeks   Status New           PT Long Term Goals - 06/09/16 1229      PT LONG TERM GOAL #1   Title Pt will demo ankle strength to 5/5 MMT which will improve her safety with daily activity.    Time 6  Period Weeks   Status New     PT LONG TERM GOAL #2   Title Pt will maintain single leg balance on the Lt for atleast 15 sec, 2/3 trials, to decrease her risk of falls and injury at work.    Time 6   Period Weeks   Status New     PT LONG TERM GOAL #3   Title Pt will demo improved ankle strength and pain evident by her ability to ambulate atleast 225 ft with proper heel strike and push off pattern for increased efficiency with being on her feet all day at work.    Time 6   Period Weeks   Status New     PT LONG TERM GOAL #4   Title Pt will report atleast 50% improvement in her mobility and activity tolerance, to increase her performance at work during the day.     Time 6   Period Weeks   Status New               Plan - 06/30/16 16100819    Clinical Impression Statement Pt is making progress with decreased pain report this past week following her last session.  Therapist reviewed pt's HEP to address issues with technique and continued with manual treatments to address joint and soft tissue restrictions throughout the foot and ankle. Pt demonstrates intrinsic foot weakness and poor activation, evident during arch lifts performed at the end of today's session. This was added to her HEP for further improvement. No increase in pain reported by the end of today's session. Will continue with current POC.    Rehab Potential Good   PT Frequency 2x / week   PT Duration 6 weeks   PT Treatment/Interventions ADLs/Self Care Home Management;Cryotherapy;Moist Heat;Gait training;Neuromuscular re-education;Balance training;Therapeutic exercise;Therapeutic activities;Functional mobility training;Stair training;Patient/family education;Orthotic Fit/Training;Manual techniques;Dry needling;Passive range of motion;Taping   PT Next Visit Plan gastroc strength; arch lifts standing; foam exercises (beam, trunk rotation, etc.)   PT Home Exercise Plan inversion stretch, inv/ever with green TB/SLS/BLE standing heel raises, standing arch lifts    Consulted and Agree with Plan of Care Patient      Patient will benefit from skilled therapeutic intervention in order to improve the following deficits and impairments:  Abnormal gait, Decreased activity tolerance, Decreased balance, Difficulty walking, Impaired flexibility, Increased muscle spasms, Postural dysfunction, Pain, Decreased strength, Decreased range of motion  Visit Diagnosis: Pain in left foot  Muscle weakness (generalized)  Other abnormalities of gait and mobility     Problem List There are no active problems to display for this patient.   8:25 AM,06/30/16 [name] PT, DPT [facility] [phone]  [facility] [address] [place], [place], [address] Phone: [phone]   Fax:  [phone]  Name: Laura Tapia MRN: [id number] Date of  Birth: Mar 29, 1960

## 2016-07-02 ENCOUNTER — Ambulatory Visit (HOSPITAL_COMMUNITY): Payer: 59 | Admitting: Physical Therapy

## 2016-07-02 DIAGNOSIS — M79672 Pain in left foot: Secondary | ICD-10-CM | POA: Diagnosis not present

## 2016-07-02 DIAGNOSIS — R2689 Other abnormalities of gait and mobility: Secondary | ICD-10-CM

## 2016-07-02 DIAGNOSIS — M6281 Muscle weakness (generalized): Secondary | ICD-10-CM

## 2016-07-02 NOTE — Patient Instructions (Signed)

## 2016-07-05 NOTE — Therapy (Signed)
[place] [facility] [address] [place], [place], [address] Phone: [phone]   Fax:  [phone]  Physical Therapy Treatment  Patient Details  Name: Laura Tapia MRN: [id number] Date of Birth: [birth year] Referring Provider: Toni Arthurs, MD   Encounter Date: 07/02/2016      PT End of Session - 07/05/16 2110    Visit Number 6   Number of Visits 13   Date for PT Re-Evaluation 06/30/16   Authorization Type UHC    Authorization Time Period 06/09/16 to 07/21/16   PT Start Time 1732   PT Stop Time 1813   PT Time Calculation (min) 41 min   Activity Tolerance Patient tolerated treatment well;No increased pain   Behavior During Therapy [facility] for tasks assessed/performed      No past medical history on file.  No past surgical history on file.  There were no vitals filed for this visit.      Subjective Assessment - 07/05/16 2108    Subjective Pt reports that things are going well. She is trying to work on her walking and other exercises added last session.    Pertinent History Being treated for RA but not any treatment    Patient Stated Goals improve foot pain    Pain Onset More than a month ago            [facility] PT Assessment - 07/05/16 0001      AROM   Right Ankle Dorsiflexion 3   Right Ankle Plantar Flexion 55   Right Ankle Inversion 16   Left Ankle Dorsiflexion 8   Left Ankle Plantar Flexion 50   Left Ankle Inversion 25     High Level Balance   High Level Balance Comments Lt: 8-10, Rt: 20+                      [facility] Adult PT Treatment/Exercise - 07/05/16 0001      Manual Therapy   Manual Therapy Soft tissue mobilization   Manual therapy comments Manual complete separate rest of tx   Soft tissue mobilization STM Medial aspect of the Ry foot along abductor hallicus; Medial gastroc/ flexor hallicus      Ankle Exercises: Standing   BAPS Standing;Other (comment)  x1 min CW/CCW LLE only    Heel Raises 20  reps;Other (comment)  single leg      Ankle Exercises: Seated   Other Seated Ankle Exercises arch squeezes x20 reps, 3 sec hold    Other Seated Ankle Exercises Rolling to Lt ankle/foot x2 min for HEP demo          Trigger Point Dry Needling - 07/05/16 2116    Consent Given? Yes   Education Handout Provided Yes   Muscles Treated Lower Body --  abductor hallicus Rt              PT Education - 07/05/16 2109    Education provided Yes   Education Details Provided handout and reviewed implications/treatment/possible reactions to dry needling treatment; encouraged following up with rolling massage to her foot following today's session   Person(s) Educated Patient   Methods Explanation;Handout;Verbal cues   Comprehension Verbalized understanding;Returned demonstration          PT Short Term Goals - 06/09/16 1228      PT SHORT TERM GOAL #1   Title Pt will demo consistency and independence with her HEP to improve ankle strength and mobility.    Time 2  Period Weeks   Status New           PT Long Term Goals - 06/09/16 1229      PT LONG TERM GOAL #1   Title Pt will demo ankle strength to 5/5 MMT which will improve her safety with daily activity.    Time 6   Period Weeks   Status New     PT LONG TERM GOAL #2   Title Pt will maintain single leg balance on the Lt for atleast 15 sec, 2/3 trials, to decrease her risk of falls and injury at work.    Time 6   Period Weeks   Status New     PT LONG TERM GOAL #3   Title Pt will demo improved ankle strength and pain evident by her ability to ambulate atleast 225 ft with proper heel strike and push off pattern for increased efficiency with being on her feet all day at work.    Time 6   Period Weeks   Status New     PT LONG TERM GOAL #4   Title Pt will report atleast 50% improvement in her mobility and activity tolerance, to increase her performance at work during the day.     Time 6   Period Weeks   Status New                Plan - 07/05/16 2111    Clinical Impression Statement Today's session continued with activity to encourage intrinsic foot and ankle strength and mobility. Pt is making progress towards improving ankle mobility and ROM, however she continues to have tenderness over the medial aspect of her foot. Verbal consent was obtained and dry needling techniques were performed to address noted trigger points in the abductor hallicus. There was noted local twitch response with treatment and therapist demonstrated IASTM techniques for home to follow today's treatments. Ended the session without report of increased pain. Will continue with current POC.    Rehab Potential Good   PT Frequency 2x / week   PT Duration 6 weeks   PT Treatment/Interventions ADLs/Self Care Home Management;Cryotherapy;Moist Heat;Gait training;Neuromuscular re-education;Balance training;Therapeutic exercise;Therapeutic activities;Functional mobility training;Stair training;Patient/family education;Orthotic Fit/Training;Manual techniques;Dry needling;Passive range of motion;Taping   PT Next Visit Plan manual to improve mid foot mobility; ankle mobility; gastroc strength; arch lifts standing; foam exercises (beam, trunk rotation, etc.)   PT Home Exercise Plan inversion stretch, inv/ever with green TB/SLS/BLE standing heel raises, standing arch lifts    Consulted and Agree with Plan of Care Patient      Patient will benefit from skilled therapeutic intervention in order to improve the following deficits and impairments:  Abnormal gait, Decreased activity tolerance, Decreased balance, Difficulty walking, Impaired flexibility, Increased muscle spasms, Postural dysfunction, Pain, Decreased strength, Decreased range of motion  Visit Diagnosis: Pain in left foot  Muscle weakness (generalized)  Other abnormalities of gait and mobility     Problem List There are no active problems to display for this patient.  9:27  PM,07/05/16 [name] PT, DPT [facility] [phone]  [facility] [address] [place], [place], [address] Phone: [phone]   Fax:  [phone]  Name: Laura Tapia MRN: [id number] Date of Birth: [birth year]

## 2016-07-07 ENCOUNTER — Ambulatory Visit (HOSPITAL_COMMUNITY): Payer: 59 | Admitting: Physical Therapy

## 2016-07-07 DIAGNOSIS — M79672 Pain in left foot: Secondary | ICD-10-CM

## 2016-07-07 DIAGNOSIS — M6281 Muscle weakness (generalized): Secondary | ICD-10-CM

## 2016-07-07 DIAGNOSIS — R2689 Other abnormalities of gait and mobility: Secondary | ICD-10-CM

## 2016-07-07 NOTE — Therapy (Signed)
[place] [facility] [address] [place], [place], [address] Phone: [phone]   Fax:  [phone]  Physical Therapy Treatment  Patient Details  Name: Laura Tapia MRN: [id number] Date of Birth: Jun 29, 1960 Referring Provider: Toni Arthurs, MD   Encounter Date: 07/07/2016      PT End of Session - 07/07/16 0821    Visit Number 7   Number of Visits 13   Date for PT Re-Evaluation 06/30/16   Authorization Type UHC    Authorization Time Period 06/09/16 to 07/21/16   PT Start Time 0731   PT Stop Time 0815   PT Time Calculation (min) 44 min   Activity Tolerance Patient tolerated treatment well;No increased pain   Behavior During Therapy [facility] for tasks assessed/performed      No past medical history on file.  No past surgical history on file.  There were no vitals filed for this visit.      Subjective Assessment - 07/07/16 0733    Subjective Pt reports that her foot is a little tender along the inside of her foot. She woke up last night with a migraine and didn't get much sleep.    Pertinent History Being treated for RA but not any treatment    Patient Stated Goals improve foot pain    Currently in Pain? Other (Comment)  Pt unable to give a rating, but worth noticing the soreness   Pain Onset More than a month ago              [facility] Adult PT Treatment/Exercise - 07/07/16 0001      Manual Therapy   Manual therapy comments Manual complete separate rest of tx   Soft tissue mobilization STM medial Lt foot, great toe flexors and lumbricals     Ankle Exercises: Stretches   Gastroc Stretch 3 reps;30 seconds;Other (comment)  slantboard    Other Stretch gastroc stretch against wall 2x30 sec each for HEP demo      Ankle Exercises: Standing   SLS LLE only on foam x 3 trials up to 20 sec each   Balance Beam Foam, x2 RT heel toe pattern    Other Standing Ankle Exercises arch lifts 20x3 sec BLE    Other Standing Ankle Exercises step up  onto 4" box with arch squeeze and contralateral knee drive O96 reps each; NBOS on foam             PT Education - 07/07/16 0820    Education provided Yes   Education Details discussed typical progression of rehab and upcoming re-evaluation   Person(s) Educated Patient   Comprehension Verbalized understanding          PT Short Term Goals - 06/09/16 1228      PT SHORT TERM GOAL #1   Title Pt will demo consistency and independence with her HEP to improve ankle strength and mobility.    Time 2   Period Weeks   Status New           PT Long Term Goals - 06/09/16 1229      PT LONG TERM GOAL #1   Title Pt will demo ankle strength to 5/5 MMT which will improve her safety with daily activity.    Time 6   Period Weeks   Status New     PT LONG TERM GOAL #2   Title Pt will maintain single leg balance on the Lt for atleast 15 sec, 2/3 trials, to decrease her risk  of falls and injury at work.    Time 6   Period Weeks   Status New     PT LONG TERM GOAL #3   Title Pt will demo improved ankle strength and pain evident by her ability to ambulate atleast 225 ft with proper heel strike and push off pattern for increased efficiency with being on her feet all day at work.    Time 6   Period Weeks   Status New     PT LONG TERM GOAL #4   Title Pt will report atleast 50% improvement in her mobility and activity tolerance, to increase her performance at work during the day.     Time 6   Period Weeks   Status New               Plan - 07/07/16 [id number]    Clinical Impression Statement Continued today with activity to address intrinsic foot muscle strength and control. Pt does continues to demonstrate compensations with toe flexion into the floor with weight bearing, so therapist encouraged arch activation to better support the foot. Ended with manual techniques to address muscle tightness throughout the abductor hallicus region and lumbricals with reported tenderness with this.  Updated pt's HEP to include gastroc stretch and she was able to demonstrate proper technique. Ended without increase in pt's pain   Rehab Potential Good   PT Frequency 2x / week   PT Duration 6 weeks   PT Treatment/Interventions ADLs/Self Care Home Management;Cryotherapy;Moist Heat;Gait training;Neuromuscular re-education;Balance training;Therapeutic exercise;Therapeutic activities;Functional mobility training;Stair training;Patient/family education;Orthotic Fit/Training;Manual techniques;Dry needling;Passive range of motion;Taping   PT Next Visit Plan gastroc stretch; ankle mobility; arch lifts standing and on various surfaces; static balance    PT Home Exercise Plan inversion stretch, inv/ever with green TB/SLS/BLE standing heel raises, standing arch lifts; gastroc stretch against wall    Consulted and Agree with Plan of Care Patient      Patient will benefit from skilled therapeutic intervention in order to improve the following deficits and impairments:  Abnormal gait, Decreased activity tolerance, Decreased balance, Difficulty walking, Impaired flexibility, Increased muscle spasms, Postural dysfunction, Pain, Decreased strength, Decreased range of motion  Visit Diagnosis: Pain in left foot  Muscle weakness (generalized)  Other abnormalities of gait and mobility     Problem List There are no active problems to display for this patient.  8:31 AM,07/07/16 [name] PT, DPT [facility] [phone]  [facility] [address] [place], [place], [address] Phone: [phone]   Fax:  [phone]  Name: Laura Tapia MRN: [id number] Date of Birth: [birth year]

## 2016-07-09 ENCOUNTER — Ambulatory Visit (HOSPITAL_COMMUNITY): Payer: 59

## 2016-07-09 DIAGNOSIS — M6281 Muscle weakness (generalized): Secondary | ICD-10-CM

## 2016-07-09 DIAGNOSIS — R2689 Other abnormalities of gait and mobility: Secondary | ICD-10-CM

## 2016-07-09 DIAGNOSIS — M79672 Pain in left foot: Secondary | ICD-10-CM

## 2016-07-09 NOTE — Therapy (Signed)
[facility] [address] [place], [place], [address] Phone: [phone]   Fax:  [phone]  Physical Therapy Treatment  Patient Details  Name: Laura Tapia MRN: [id number] Date of Birth: 1960-01-20 Referring Provider: Toni Arthurs, MD  Encounter Date: 07/09/2016      PT End of Session - 07/09/16 1746    Visit Number 8   Number of Visits 13   Date for PT Re-Evaluation 06/30/16   Authorization Type UHC    Authorization Time Period 06/09/16 to 07/21/16   PT Start Time 1733   PT Stop Time 1812   PT Time Calculation (min) 39 min   Activity Tolerance Patient tolerated treatment well;No increased pain   Behavior During Therapy [facility] for tasks assessed/performed      No past medical history on file.  No past surgical history on file.  There were no vitals filed for this visit.      Subjective Assessment - 07/09/16 1740    Subjective Pt stated she is feeling good today, reports she can feel progress.  No reports of pain today.     Pertinent History Being treated for RA but not any treatment    Patient Stated Goals improve foot pain    Currently in Pain? No/denies            [facility] PT Assessment - 07/09/16 0001      Assessment   Medical Diagnosis posterior tibial tendonitis    Referring Provider Toni Arthurs, MD   Next MD Visit 07/20/16                     [facility] Adult PT Treatment/Exercise - 07/09/16 0001      Manual Therapy   Manual Therapy Soft tissue mobilization   Manual therapy comments Manual complete separate rest of tx   Soft tissue mobilization STM medial Lt foot, great toe flexors and lumbricals     Ankle Exercises: Standing   SLS LLE only on foam x 3 trials up to 20 sec each   Balance Beam Foam, x2 RT heel toe pattern    Other Standing Ankle Exercises arch lifts 20x3 sec BLE; 2nd set 10 reps holding for 15" for endurance strangthening   Other Standing Ankle Exercises step up onto 4" box with arch  squeeze and contralateral knee drive O96 reps each; NBOS on foam      Ankle Exercises: Stretches   Slant Board Stretch 3 reps;30 seconds                  PT Short Term Goals - 06/09/16 1228      PT SHORT TERM GOAL #1   Title Pt will demo consistency and independence with her HEP to improve ankle strength and mobility.    Time 2   Period Weeks   Status New           PT Long Term Goals - 06/09/16 1229      PT LONG TERM GOAL #1   Title Pt will demo ankle strength to 5/5 MMT which will improve her safety with daily activity.    Time 6   Period Weeks   Status New     PT LONG TERM GOAL #2   Title Pt will maintain single leg balance on the Lt for atleast 15 sec, 2/3 trials, to decrease her risk of falls and injury at work.    Time 6   Period Weeks   Status New  PT LONG TERM GOAL #3   Title Pt will demo improved ankle strength and pain evident by her ability to ambulate atleast 225 ft with proper heel strike and push off pattern for increased efficiency with being on her feet all day at work.    Time 6   Period Weeks   Status New     PT LONG TERM GOAL #4   Title Pt will report atleast 50% improvement in her mobility and activity tolerance, to increase her performance at work during the day.     Time 6   Period Weeks   Status New               Plan - 07/09/16 1754    Clinical Impression Statement Continued session focus with intrinsic musculature strengthening and control to better support the foot for pain control.  Pt continues to have difficulty complete arch formation without compensation of toe flexion.  Pt able to complete with verbal and tactile cueing static, increased hold time this session for endurance strengthening.  Moderate difficutly keeping arch formation with dynamic movements today partly due to weakness and neuro-re ed for proper activaiton.  EOS with manual soft tissue massage to reduce tightness with abductor hallicus regioan and  lumbricals, some reports of tenderness wiht palpation.  No reports of pain at EOS.     Rehab Potential Good   PT Duration 6 weeks   PT Treatment/Interventions ADLs/Self Care Home Management;Cryotherapy;Moist Heat;Gait training;Neuromuscular re-education;Balance training;Therapeutic exercise;Therapeutic activities;Functional mobility training;Stair training;Patient/family education;Orthotic Fit/Training;Manual techniques;Dry needling;Passive range of motion;Taping   PT Next Visit Plan Reassess next session (MD apt 7/9).  Continue with gastroc stretch; ankle mobility; arch lifts standing and on various surfaces; static balance    PT Home Exercise Plan inversion stretch, inv/ever with green TB/SLS/BLE standing heel raises, standing arch lifts; gastroc stretch against wall       Patient will benefit from skilled therapeutic intervention in order to improve the following deficits and impairments:  Abnormal gait, Decreased activity tolerance, Decreased balance, Difficulty walking, Impaired flexibility, Increased muscle spasms, Postural dysfunction, Pain, Decreased strength, Decreased range of motion  Visit Diagnosis: Pain in left foot  Muscle weakness (generalized)  Other abnormalities of gait and mobility     Problem List There are no active problems to display for this patient.  [address]Laura Cockerham, LPTA; CBIS [phone]  Laura Tapia, Laura Tapia 07/09/2016, 6:22 PM  [facility] [address] [place], [place], [address] Phone: [phone]   Fax:  [phone]  Name: Laura Tapia MRN: [id number] Date of Birth: 05-31-1960

## 2016-07-14 ENCOUNTER — Ambulatory Visit (HOSPITAL_COMMUNITY): Payer: 59 | Attending: Orthopedic Surgery | Admitting: Physical Therapy

## 2016-07-14 ENCOUNTER — Ambulatory Visit (HOSPITAL_COMMUNITY)
Admission: RE | Admit: 2016-07-14 | Discharge: 2016-07-14 | Disposition: A | Discharge status: Home / Self Care | Payer: 59 | Source: Ambulatory Visit | Attending: Neurosurgery | Admitting: Neurosurgery

## 2016-07-14 ENCOUNTER — Other Ambulatory Visit (HOSPITAL_COMMUNITY): Payer: Self-pay | Admitting: Neurosurgery

## 2016-07-14 DIAGNOSIS — R2689 Other abnormalities of gait and mobility: Secondary | ICD-10-CM | POA: Insufficient documentation

## 2016-07-14 DIAGNOSIS — M6281 Muscle weakness (generalized): Secondary | ICD-10-CM | POA: Diagnosis not present

## 2016-07-14 DIAGNOSIS — M47816 Spondylosis without myelopathy or radiculopathy, lumbar region: Secondary | ICD-10-CM | POA: Insufficient documentation

## 2016-07-14 DIAGNOSIS — M79672 Pain in left foot: Secondary | ICD-10-CM | POA: Diagnosis not present

## 2016-07-14 DIAGNOSIS — M5136 Other intervertebral disc degeneration, lumbar region: Secondary | ICD-10-CM | POA: Diagnosis not present

## 2016-07-14 DIAGNOSIS — M47896 Other spondylosis, lumbar region: Secondary | ICD-10-CM | POA: Diagnosis not present

## 2016-07-14 NOTE — Therapy (Signed)
[facility] [address] [place], [place], [address] Phone: [phone]   Fax:  [phone]  Physical Therapy Treatment  Patient Details  Name: Laura Tapia MRN: [id number] Date of Birth: [birth year] Referring Provider: Toni ArthursJohn Hewitt, MD  Encounter Date: 07/14/2016      PT End of Session - 07/14/16 0735    Visit Number 9   Number of Visits 13   Date for PT Re-Evaluation 06/30/16   Authorization Type UHC    Authorization Time Period 06/09/16 to 07/21/16   PT Start Time 0730   PT Stop Time 0814   PT Time Calculation (min) 44 min   Activity Tolerance Patient tolerated treatment well;No increased pain   Behavior [facility] for tasks assessed/performed      No past medical history on file.  No past surgical history on file.  There were no vitals filed for this visit.      Subjective Assessment - 07/14/16 0734    Subjective Pt reports that things are going ok. Nothing new to report at this time. She continues to perform her HEP.    Pertinent History Being treated for RA but not any treatment    Patient Stated Goals improve foot pain    Currently in Pain? No/denies              [facility] Adult PT Treatment/Exercise - 07/14/16 0001      Manual Therapy   Manual therapy comments Manual complete separate rest of tx   Joint Mobilization Grade IV AP talocrural mobs, LLE; Lt talocrural mobilization with movement x15 reps, 12" box      Ankle Exercises: Seated   BAPS Sitting;Other (comment)  ABCs x2 reps    Other Seated Ankle Exercises ankle eccentric control with blue TB resistance into eversion x15 reps; ankle inversion x20 reps double blue TB      Ankle Exercises: Stretches   Slant Board Stretch 3 reps;30 seconds     Ankle Exercises: Standing   Other Standing Ankle Exercises tandem hold on foam 3x30 sec each LE forward    Other Standing Ankle Exercises single leg ankle inversion x10 reps, 3 sec hold; standing Lt ankle  mobilization with movement x15 reps.                 PT Education - 07/14/16 0858    Education provided Yes   Education Details updated and reviewed HEP   Person(s) Educated Patient   Methods Explanation;Demonstration;Tactile cues;Handout   Comprehension Verbalized understanding;Returned demonstration          PT Short Term Goals - 06/09/16 1228      PT SHORT TERM GOAL #1   Title Pt will demo consistency and independence with her HEP to improve ankle strength and mobility.    Time 2   Period Weeks   Status New           PT Long Term Goals - 06/09/16 1229      PT LONG TERM GOAL #1   Title Pt will demo ankle strength to 5/5 MMT which will improve her safety with daily activity.    Time 6   Period Weeks   Status New     PT LONG TERM GOAL #2   Title Pt will maintain single leg balance on the Lt for atleast 15 sec, 2/3 trials, to decrease her risk of falls and injury at work.    Time 6   Period Weeks   Status  New     PT LONG TERM GOAL #3   Title Pt will demo improved ankle strength and pain evident by her ability to ambulate atleast 225 ft with proper heel strike and push off pattern for increased efficiency with being on her feet all day at work.    Time 6   Period Weeks   Status New     PT LONG TERM GOAL #4   Title Pt will report atleast 50% improvement in her mobility and activity tolerance, to increase her performance at work during the day.     Time 6   Period Weeks   Status New               Plan - 07/14/16 1610    Clinical Impression Statement Pt is making steady progress towards her goals with improving intrinsic muscle activation. Session focused on therex to address ankle ROM and posterior tib strength and control. Pt reporting no increase in pain with today's activities, and HEP was updated to address remaining deficits in ankle ROM, strength and neuromuscular control. Will reassess next session prior to return to MD.   Rehab Potential  Good   PT Duration 6 weeks   PT Treatment/Interventions ADLs/Self Care Home Management;Cryotherapy;Moist Heat;Gait training;Neuromuscular re-education;Balance training;Therapeutic exercise;Therapeutic activities;Functional mobility training;Stair training;Patient/family education;Orthotic Fit/Training;Manual techniques;Dry needling;Passive range of motion;Taping   PT Next Visit Plan reassess next session for MD; single leg inversion   PT Home Exercise Plan inversion stretch 5x10", standing arch lifts 20x3"; gastroc stretch against wall 3x30"; ankle inversion with blue TB x25 reps; tandem stance 3x30 sec each   Consulted and Agree with Plan of Care Patient      Patient will benefit from skilled therapeutic intervention in order to improve the following deficits and impairments:  Abnormal gait, Decreased activity tolerance, Decreased balance, Difficulty walking, Impaired flexibility, Increased muscle spasms, Postural dysfunction, Pain, Decreased strength, Decreased range of motion  Visit Diagnosis: Pain in left foot  Muscle weakness (generalized)  Other abnormalities of gait and mobility     Problem List There are no active problems to display for this patient.   9:00 AM,07/14/16 Marylyn Ishihara PT, DPT [facility] [phone]  [facility] [address] [place], [place], [address] Phone: [phone]   Fax:  [phone]  Name: Laura Tapia MRN: [id number] Date of Birth: 22-Nov-1960

## 2016-07-16 ENCOUNTER — Ambulatory Visit (HOSPITAL_COMMUNITY): Payer: 59

## 2016-07-16 DIAGNOSIS — M79672 Pain in left foot: Secondary | ICD-10-CM | POA: Diagnosis not present

## 2016-07-16 DIAGNOSIS — R2689 Other abnormalities of gait and mobility: Secondary | ICD-10-CM

## 2016-07-16 DIAGNOSIS — M6281 Muscle weakness (generalized): Secondary | ICD-10-CM

## 2016-07-16 NOTE — Therapy (Signed)
[facility] [address] [place], [place], [address] Phone: [phone]   Fax:  [phone]  Physical Therapy Treatment  Patient Details  Name: Laura Tapia MRN: [id number] Date of Birth: [birth year] Referring Provider: Toni Arthurs, MD  Encounter Date: 07/16/2016      PT End of Session - 07/16/16 1742    Visit Number 10   Number of Visits 13   Date for PT Re-Evaluation 08/19/16   Authorization Type UHC    Authorization Time Period 06/09/16 to 07/21/16 NEW: 07/22/16 to 08/19/16   PT Start Time 1740   PT Stop Time 1830   PT Time Calculation (min) 50 min   Activity Tolerance Patient tolerated treatment well;No increased pain   Behavior During Therapy [facility] for tasks assessed/performed      No past medical history on file.  No past surgical history on file.  There were no vitals filed for this visit.      Subjective Assessment - 07/16/16 1731    Subjective Pt reports things are going well, no new reports at this time.  Reports compliance wiht new HEP without question   Pertinent History Being treated for RA but not any treatment    Patient Stated Goals improve foot pain    Currently in Pain? No/denies            [facility] PT Assessment - 07/16/16 0001      Assessment   Medical Diagnosis posterior tibial tendonitis    Referring Provider Toni Arthurs, MD   Onset Date/Surgical Date --  Oct 2017   Next MD Visit 07/20/16   Prior Therapy none      AROM   AROM Assessment Site Ankle   Right/Left Ankle Right;Left   Right Ankle Dorsiflexion 12  was 3   Right Ankle Plantar Flexion 58  was 55   Right Ankle Inversion 28  was 16   Right Ankle Eversion 20   Left Ankle Dorsiflexion --  was 8   Left Ankle Plantar Flexion --  was 50   Left Ankle Inversion --  was 25     Strength   Left Ankle Plantar Flexion --  pain with SLS PF   Left Ankle Inversion 4+/5                     [facility] Adult PT Treatment/Exercise -  07/16/16 0001      Manual Therapy   Manual Therapy Soft tissue mobilization   Manual therapy comments Manual complete separate rest of tx   Soft tissue mobilization STM medial Lt foot, great toe flexors and lumbricals     Ankle Exercises: Seated   BAPS Level 3;10 reps   Other Seated Ankle Exercises ankle eccentric control with blue TB resistance into eversion x15 reps; ankle inversion x20 reps double blue TB      Ankle Exercises: Stretches   Slant Board Stretch 3 reps;30 seconds     Ankle Exercises: Standing   Other Standing Ankle Exercises tandem hold on foam 3x30 sec each LE forward    Other Standing Ankle Exercises 3 sets; single leg ankle inversion x10 reps, 3 sec hold; standing Lt ankle mobilization with movement x15 reps.                 PT Education - 07/16/16 2032    Education provided Yes   Education Details reviewed goals; discussed update in POC and encouraged further discussion with MD concerning return to work  Person(s) Educated Patient   Methods Explanation   Comprehension Verbalized understanding          PT Short Term Goals - 07/16/16 1759      PT SHORT TERM GOAL #1   Title Pt will demo consistency and independence with her HEP to improve ankle strength and mobility.    Baseline 07/16/16:  Reports complaince with HEP daily   Status Achieved           PT Long Term Goals - 07/16/16 1800      PT LONG TERM GOAL #1   Title Pt will demo ankle strength to 5/5 MMT which will improve her safety with daily activity.    Status On-going     PT LONG TERM GOAL #2   Title Pt will maintain single leg balance on the Lt for atleast 15 sec, 2/3 trials, to decrease her risk of falls and injury at work.    Baseline 07/16/16: Lt SLS 17", 21", and 8"   Status Achieved     PT LONG TERM GOAL #3   Title Pt will demo improved ankle strength and pain evident by her ability to ambulate atleast 225 ft with proper heel strike and push off pattern for increased  efficiency with being on her feet all day at work.    Status On-going     PT LONG TERM GOAL #4   Title Pt will report atleast 50% improvement in her mobility and activity tolerance, to increase her performance at work during the day.     Baseline 07/16/16:  Pt reports she has improved 50% continues to have pain daily though reports reduced intensity, same frequency though has not RTW to assess standing all day               Plan - 07/16/16 1830    Clinical Impression Statement Reviewed goals prior MD apt next week.  Pt is making steady progress toward goals.  Pt reports compliance with updated HEP daily, able to verbalize and demonstrate.  Improved ROM and improving gait mechanics with heel to toe sequence.  Pt does continue to demonstrate weakness wiht posterior tib and gastroc/soleus complex with reports of pain following 4 reps with SLS plantarflexion, do feel as though LE may have been fatigued prior testing strength at EOS.  Continued session focus on improving ankle ROM and strengthening with manual technqiues at EOS for pain control.  Evaluation PT aware of measurements and status with goals.      Rehab Potential Good   PT Frequency Other (comment)  1-2x/week   PT Duration 4 weeks   PT Treatment/Interventions ADLs/Self Care Home Management;Cryotherapy;Moist Heat;Gait training;Neuromuscular re-education;Balance training;Therapeutic exercise;Therapeutic activities;Functional mobility training;Stair training;Patient/family education;Orthotic Fit/Training;Manual techniques;Dry needling;Passive range of motion;Taping   PT Next Visit Plan F/U with MD.  Continue wiht single leg inversion therex next session. gastroc strength   PT Home Exercise Plan inversion stretch 5x10", standing arch lifts 20x3"; gastroc stretch against wall 3x30"; ankle inversion with blue TB x25 reps; tandem stance 3x30 sec each   Consulted and Agree with Plan of Care Patient      Patient will benefit from skilled  therapeutic intervention in order to improve the following deficits and impairments:  Abnormal gait, Decreased activity tolerance, Decreased balance, Difficulty walking, Impaired flexibility, Increased muscle spasms, Postural dysfunction, Pain, Decreased strength, Decreased range of motion  Visit Diagnosis: Pain in left foot  Muscle weakness (generalized)  Other abnormalities of gait and mobility     Problem List  There are no active problems to display for this patient.  [address] [place]; CBIS [phone]  Marylyn Ishihara 07/16/2016, 8:37 PM  [facility] [address] [place], [place], [address] Phone: [phone]   Fax:  [phone]  Name: Laura Tapia MRN: [id number] Date of Birth: December 30, 1960

## 2016-07-17 ENCOUNTER — Telehealth (HOSPITAL_COMMUNITY): Payer: Self-pay | Admitting: Physical Therapy

## 2016-07-17 NOTE — Telephone Encounter (Signed)
L/m confirming apptment was cx on 7/10th, we are holding 7/11 spot waiting on pt to call to accept, also remined pt of her apptment on 7/12 @ 5:30pm. NF 07/17/16

## 2016-07-20 DIAGNOSIS — M76822 Posterior tibial tendinitis, left leg: Secondary | ICD-10-CM | POA: Diagnosis not present

## 2016-07-20 DIAGNOSIS — M79672 Pain in left foot: Secondary | ICD-10-CM | POA: Diagnosis not present

## 2016-07-21 ENCOUNTER — Ambulatory Visit (HOSPITAL_COMMUNITY): Payer: 59 | Admitting: Physical Therapy

## 2016-07-22 ENCOUNTER — Ambulatory Visit (HOSPITAL_COMMUNITY): Payer: 59 | Admitting: Physical Therapy

## 2016-07-22 ENCOUNTER — Telehealth (HOSPITAL_COMMUNITY): Payer: Self-pay | Admitting: General Practice

## 2016-07-22 NOTE — Telephone Encounter (Signed)
07/22/16  pt called to say that she had a migraine and can't come in today, she left a message

## 2016-07-23 ENCOUNTER — Other Ambulatory Visit (HOSPITAL_COMMUNITY): Payer: Self-pay | Admitting: Neurosurgery

## 2016-07-23 ENCOUNTER — Ambulatory Visit (HOSPITAL_COMMUNITY): Payer: 59 | Admitting: Physical Therapy

## 2016-07-23 DIAGNOSIS — M47816 Spondylosis without myelopathy or radiculopathy, lumbar region: Secondary | ICD-10-CM

## 2016-07-23 DIAGNOSIS — R2689 Other abnormalities of gait and mobility: Secondary | ICD-10-CM

## 2016-07-23 DIAGNOSIS — M79672 Pain in left foot: Secondary | ICD-10-CM | POA: Diagnosis not present

## 2016-07-23 DIAGNOSIS — M6281 Muscle weakness (generalized): Secondary | ICD-10-CM

## 2016-07-23 NOTE — Therapy (Signed)
[facility] [address] [place], [place], [address] Phone: [phone]   Fax:  [phone]  Physical Therapy Treatment  Patient Details  Name: Laura Tapia MRN: [id number] Date of Birth: 1960/07/28 Referring Provider: Toni Arthurs, MD  Encounter Date: 07/23/2016      PT End of Session - 07/23/16 1822    Visit Number 11   Number of Visits 19   Date for PT Re-Evaluation 08/19/16   Authorization Type UHC    Authorization Time Period 06/09/16 to 07/21/16 NEW: 07/22/16 to 08/19/16   PT Start Time 1730   PT Stop Time 1810   PT Time Calculation (min) 40 min   Activity Tolerance Patient tolerated treatment well;No increased pain   Behavior During Therapy [facility] for tasks assessed/performed      No past medical history on file.  No past surgical history on file.  There were no vitals filed for this visit.      Subjective Assessment - 07/23/16 1734    Subjective Pt reports things are going well. She returned to the MD and they are planning to have her return to work on August 1st. She continues to work on her HEP without issue and feels that things improve daily.    Pertinent History Being treated for RA but not any treatment    Patient Stated Goals improve foot pain    Currently in Pain? No/denies                         [facility] Adult PT Treatment/Exercise - 07/23/16 0001      Manual Therapy   Manual therapy comments Manual complete separate rest of tx   Soft tissue mobilization Ice massage along distal posterior tib tendon (Lt)     Ankle Exercises: Supine   T-Band Lt ankle PF with blue TB x30 reps      Ankle Exercises: Seated   Other Seated Ankle Exercises ankel inversion isometric against ball 15x3 sec hold; ankle inversion 2x15 reps      Ankle Exercises: Standing   Heel Raises 15 reps;Other (comment)  x2 sets, BLE up, LLE down    Balance Beam x3 RT with heel toe pattern. Foam    Other Standing Ankle Exercises  forward step up onto 6" step with foam pad 2x10 reps, contralateral knee drive and no UE support, last set with dyna disc x5 reps   Other Standing Ankle Exercises Tandem Lt ankle inversion hold x15 reps                PT Education - 07/23/16 1821    Education provided Yes   Education Details technique with therex; updated pt HEP and provided handout; discussed ice massage technique and reivewed benefits/set up   Person(s) Educated Patient   Methods Explanation;Demonstration;Handout   Comprehension Verbalized understanding;Returned demonstration          PT Short Term Goals - 07/16/16 1759      PT SHORT TERM GOAL #1   Title Pt will demo consistency and independence with her HEP to improve ankle strength and mobility.    Baseline 07/16/16:  Reports complaince with HEP daily   Status Achieved           PT Long Term Goals - 07/16/16 1800      PT LONG TERM GOAL #1   Title Pt will demo ankle strength to 5/5 MMT which will improve her safety with daily activity.  Status On-going     PT LONG TERM GOAL #2   Title Pt will maintain single leg balance on the Lt for atleast 15 sec, 2/3 trials, to decrease her risk of falls and injury at work.    Baseline 07/16/16: Lt SLS 17", 21", and 8"   Status Achieved     PT LONG TERM GOAL #3   Title Pt will demo improved ankle strength and pain evident by her ability to ambulate atleast 225 ft with proper heel strike and push off pattern for increased efficiency with being on her feet all day at work.    Status On-going     PT LONG TERM GOAL #4   Title Pt will report atleast 50% improvement in her mobility and activity tolerance, to increase her performance at work during the day.     Baseline 07/16/16:  Pt reports she has improved 50% continues to have pain daily though reports reduced intensity, same frequency though has not RTW to assess standing all day               Plan - 07/23/16 1823    Clinical Impression Statement Pt  continues to report decreasing levels of pain with activity and exercises. Session focused on inversion foot strength and ROM, in addition to gastroc strength and control in closed chain positions. Pt demonstrated improved ankle reactions during neuromuscular re-education activity, without LOB during more difficult exercises. Ended session with ice massage with therapist demo and education provided for pt to complete at home. Ended session without any report of increased pain.   Rehab Potential Good   PT Frequency Other (comment)  1-2x/week   PT Duration 4 weeks   PT Treatment/Interventions ADLs/Self Care Home Management;Cryotherapy;Moist Heat;Gait training;Neuromuscular re-education;Balance training;Therapeutic exercise;Therapeutic activities;Functional mobility training;Stair training;Patient/family education;Orthotic Fit/Training;Manual techniques;Dry needling;Passive range of motion;Taping   PT Next Visit Plan single leg inversion, gastroc eccentric strength on step, continue with ankle proprioception strength    PT Home Exercise Plan inversion isometric 3" x15 reps, ankle inversion/eversion seated x15 reps, eccentric gastroc strengthening (standing) 2x10 reps    Consulted and Agree with Plan of Care Patient      Patient will benefit from skilled therapeutic intervention in order to improve the following deficits and impairments:  Abnormal gait, Decreased activity tolerance, Decreased balance, Difficulty walking, Impaired flexibility, Increased muscle spasms, Postural dysfunction, Pain, Decreased strength, Decreased range of motion  Visit Diagnosis: Muscle weakness (generalized)  Pain in left foot  Other abnormalities of gait and mobility     Problem List There are no active problems to display for this patient.  6:31 PM,07/23/16 [name] PT, DPT [facility] [phone]  [facility] [address] [place], [place], [address] Phone: [phone]   Fax:  [phone]  Name: Laura Tapia MRN: [id number] Date of Birth: [birth year]

## 2016-07-28 ENCOUNTER — Ambulatory Visit (HOSPITAL_COMMUNITY): Payer: 59 | Admitting: Physical Therapy

## 2016-07-28 DIAGNOSIS — M6281 Muscle weakness (generalized): Secondary | ICD-10-CM

## 2016-07-28 DIAGNOSIS — M064 Inflammatory polyarthropathy: Secondary | ICD-10-CM | POA: Diagnosis not present

## 2016-07-28 DIAGNOSIS — M79672 Pain in left foot: Secondary | ICD-10-CM

## 2016-07-28 DIAGNOSIS — R2689 Other abnormalities of gait and mobility: Secondary | ICD-10-CM

## 2016-07-28 DIAGNOSIS — M459 Ankylosing spondylitis of unspecified sites in spine: Secondary | ICD-10-CM | POA: Diagnosis not present

## 2016-07-28 DIAGNOSIS — M533 Sacrococcygeal disorders, not elsewhere classified: Secondary | ICD-10-CM | POA: Diagnosis not present

## 2016-07-28 NOTE — Therapy (Signed)
[facility] [address] [place], [place], [address] Phone: [phone]   Fax:  [phone]  Physical Therapy Treatment  Patient Details  Name: Laura Tapia Mainor MRN: [id number] Date of Birth: 1960/04/10 Referring Provider: Toni ArthursJohn Hewitt, MD  Encounter Date: 07/28/2016      PT End of Session - 07/28/16 0746    Visit Number 12   Number of Visits 19   Date for PT Re-Evaluation 08/19/16   Authorization Type UHC    Authorization Time Period 06/09/16 to 07/21/16 NEW: 07/22/16 to 08/19/16   PT Start Time 0730   PT Stop Time 0800  Pt requesting to end early due to another appointment.    PT Time Calculation (min) 30 min   Activity Tolerance Patient tolerated treatment well;No increased pain   Behavior [facility] Therapy [facility] tasks assessed/performed      No past medical history on file.  No past surgical history on file.  There were no vitals filed for this visit.      Subjective Assessment - 07/28/16 0732    Subjective Pt reports that things are going well. She was sore the day following her session, but this was related to muscle soreness more than anything. Her foot isn't really bothering her today.    Pertinent History Being treated for RA but not any treatment    Patient Stated Goals improve foot pain    Currently in Pain? No/denies                         [facility] - 07/28/16 0001      Exercises   Exercises Other Exercises   Other Exercises  quadruped hip hikes 2x15 reps each      Ankle Exercises: Standing   SLS 2x20 sec each, on foam   Heel Raises 10 reps;Other (comment)  x2 sets with LLE eccentric, on slight incline    Other Standing Ankle Exercises Standing inversion, Lt only, 2x10 reps with BLE on ground   Other Standing Ankle Exercises Wall squats 2x15 reps with blue TB around knees and toes lifted      Ankle Exercises: Supine   Other Supine Ankle Exercises BLE bridge 2x15 reps  with blue TB around knees, toes lifted; attempted bridge hold with alt knee extension x5 reps each (+) hip rotation noted                 PT Education - 07/28/16 0800    Education provided Yes   Education Details importance of including hip strength and control into therex for additional arch control   Person(s) Educated Patient   Methods Explanation   Comprehension Verbalized understanding          PT Short Term Goals - 07/16/16 1759      PT SHORT TERM GOAL #1   Title Pt will demo consistency and independence with her HEP to improve ankle strength and mobility.    Baseline 07/16/16:  Reports complaince with HEP daily   Status Achieved           PT Long Term Goals - 07/16/16 1800      PT LONG TERM GOAL #1   Title Pt will demo ankle strength to 5/5 MMT which will improve her safety with daily activity.    Status On-going     PT LONG TERM GOAL #2   Title Pt will maintain single leg balance on the Lt for atleast  15 sec, 2/3 trials, to decrease her risk of falls and injury at work.    Baseline 07/16/16: Lt SLS 17", 21", and 8"   Status Achieved     PT LONG TERM GOAL #3   Title Pt will demo improved ankle strength and pain evident by her ability to ambulate atleast 225 ft with proper heel strike and push off pattern for increased efficiency with being on her feet all day at work.    Status On-going     PT LONG TERM GOAL #4   Title Pt will report atleast 50% improvement in her mobility and activity tolerance, to increase her performance at work during the day.     Baseline 07/16/16:  Pt reports she has improved 50% continues to have pain daily though reports reduced intensity, same frequency though has not RTW to assess standing all day               Plan - 07/28/16 0802    Clinical Impression Statement Continued this session with therex to improve ankle control/strength/endurance. Pt is completing her exercises at home with improving ease. Added therex to  incorporate both hip and ankle strength this session with use of therabands to provide neuromuscular facilitation of proper technique and prevent knee valgus deviation. Pt demonstrated increased difficulty with hip rotary stability, will continue to address this in future sessions. Ended without report of increase in pain.    Rehab Potential Good   PT Frequency Other (comment)  1-2x/week   PT Duration 4 weeks   PT Treatment/Interventions ADLs/Self Care Home Management;Cryotherapy;Moist Heat;Gait training;Neuromuscular re-education;Balance training;Therapeutic exercise;Therapeutic activities;Functional mobility training;Stair training;Patient/family education;Orthotic Fit/Training;Manual techniques;Dry needling;Passive range of motion;Taping   PT Next Visit Plan progress standing inversion/PF strength; single leg proprioception activity; check piriformis for trigger points/spasm and treat if necessary to improve hip rotator activation.    PT Home Exercise Plan inversion isometric 3" x15 reps, ankle inversion/eversion seated x15 reps, eccentric gastroc strengthening (standing) 2x10 reps    Consulted and Agree with Plan of Care Patient      Patient will benefit from skilled therapeutic intervention in order to improve the following deficits and impairments:  Abnormal gait, Decreased activity tolerance, Decreased balance, Difficulty walking, Impaired flexibility, Increased muscle spasms, Postural dysfunction, Pain, Decreased strength, Decreased range of motion  Visit Diagnosis: Muscle weakness (generalized)  Pain in left foot  Other abnormalities of gait and mobility     Problem List There are no active problems to display for this patient.    8:08 AM,07/28/16 Marylyn Ishihara PT, DPT [facility] [phone]  [facility] [address] [place], [place], [address] Phone: [phone]   Fax:  [phone]  Name: SHAWNIKA PEPIN MRN: [id number] Date of Birth: [birth year]

## 2016-07-29 ENCOUNTER — Ambulatory Visit (HOSPITAL_COMMUNITY): Payer: 59

## 2016-07-29 ENCOUNTER — Ambulatory Visit (HOSPITAL_COMMUNITY)
Admission: RE | Admit: 2016-07-29 | Discharge: 2016-07-29 | Disposition: A | Discharge status: Home / Self Care | Payer: 59 | Source: Ambulatory Visit | Attending: Neurosurgery | Admitting: Neurosurgery

## 2016-07-29 DIAGNOSIS — M5126 Other intervertebral disc displacement, lumbar region: Secondary | ICD-10-CM | POA: Insufficient documentation

## 2016-07-29 DIAGNOSIS — M4696 Unspecified inflammatory spondylopathy, lumbar region: Secondary | ICD-10-CM | POA: Diagnosis not present

## 2016-07-29 DIAGNOSIS — M47816 Spondylosis without myelopathy or radiculopathy, lumbar region: Secondary | ICD-10-CM

## 2016-07-29 DIAGNOSIS — M545 Low back pain: Secondary | ICD-10-CM | POA: Diagnosis not present

## 2016-07-29 DIAGNOSIS — M48061 Spinal stenosis, lumbar region without neurogenic claudication: Secondary | ICD-10-CM | POA: Diagnosis not present

## 2016-07-30 ENCOUNTER — Ambulatory Visit (HOSPITAL_COMMUNITY): Payer: 59

## 2016-07-30 DIAGNOSIS — M6281 Muscle weakness (generalized): Secondary | ICD-10-CM

## 2016-07-30 DIAGNOSIS — M79672 Pain in left foot: Secondary | ICD-10-CM | POA: Diagnosis not present

## 2016-07-30 DIAGNOSIS — R2689 Other abnormalities of gait and mobility: Secondary | ICD-10-CM

## 2016-07-30 NOTE — Therapy (Signed)
[facility] [address] [place], [place], [address] Phone: [phone]   Fax:  [phone]  Physical Therapy Treatment  Patient Details  Name: Laura Tapia MRN: [id number] Date of Birth: [birth year] Referring Provider: Toni ArthursJohn Hewitt, MD  Encounter Date: 07/30/2016      PT End of Session - 07/30/16 1742    Visit Number 13   Number of Visits 19   Date for PT Re-Evaluation 08/19/16   Authorization Type UHC    Authorization Time Period 06/09/16 to 07/21/16 NEW: 07/22/16 to 08/19/16   PT Start Time 1735   PT Stop Time 1816   PT Time Calculation (min) 41 min   Activity Tolerance Patient tolerated treatment well;No increased pain   Behavior During Therapy [facility] for tasks assessed/performed      No past medical history on file.  No past surgical history on file.  There were no vitals filed for this visit.      Subjective Assessment - 07/30/16 1740    Subjective Pt stated she is doing well today, Lt foot is mainly sore and achey today.  Reports MRI complete yesterday to assess lower back   Pertinent History Being treated for RA but not any treatment    Patient Stated Goals improve foot pain    Currently in Pain? Yes   Pain Score 3    Pain Location Foot   Pain Orientation Left;Medial   Pain Descriptors / Indicators Aching;Sore   Pain Type Chronic pain   Pain Onset More than a month ago   Pain Frequency Constant   Aggravating Factors  being on feet too long, pointing toes   Pain Relieving Factors compression, ice and being off of feet                         [facility] Adult PT Treatment/Exercise - 07/30/16 0001      Manual Therapy   Manual Therapy Other (comment)   Manual therapy comments Manual complete separate rest of tx   Other Manual Therapy Trigger point Rt piriformis Lt sidelying with pillow between knees     Ankle Exercises: Standing   Vector Stance Left;3 reps;5 seconds  arch formation   SLS 2x20 sec each,  on foam   Heel Raises 15 reps;1 second  2   Other Standing Ankle Exercises Standing inversion, Lt only, 2x10 reps with BLE on ground   Other Standing Ankle Exercises Wall squats 2x15 reps with blue TB around knees and toes lifted      Ankle Exercises: Supine   Other Supine Ankle Exercises BLE bridge 2x15 reps with blue TB around knees, toes lifted                   PT Short Term Goals - 07/16/16 1759      PT SHORT TERM GOAL #1   Title Pt will demo consistency and independence with her HEP to improve ankle strength and mobility.    Baseline 07/16/16:  Reports complaince with HEP daily   Status Achieved           PT Long Term Goals - 07/16/16 1800      PT LONG TERM GOAL #1   Title Pt will demo ankle strength to 5/5 MMT which will improve her safety with daily activity.    Status On-going     PT LONG TERM GOAL #2   Title Pt will maintain single leg balance on the Lt  for atleast 15 sec, 2/3 trials, to decrease her risk of falls and injury at work.    Baseline 07/16/16: Lt SLS 17", 21", and 8"   Status Achieved     PT LONG TERM GOAL #3   Title Pt will demo improved ankle strength and pain evident by her ability to ambulate atleast 225 ft with proper heel strike and push off pattern for increased efficiency with being on her feet all day at work.    Status On-going     PT LONG TERM GOAL #4   Title Pt will report atleast 50% improvement in her mobility and activity tolerance, to increase her performance at work during the day.     Baseline 07/16/16:  Pt reports she has improved 50% continues to have pain daily though reports reduced intensity, same frequency though has not RTW to assess standing all day               Plan - 07/30/16 1812    Clinical Impression Statement Continued session focus with ankle stabilty/control/strengthening.  Pt progressing well with reports of compliance with HEP daily.  Added vector stance with arch formation to improve hip stability  and ankle strengthening with cueing to improve form.  Continued with theraband as neuromuscular facilitatin to improve techqniues and reduce knee valgus deviation wiht task.  EOS assessed hip musculature tightness, trigger point release complete to Rt piriformis with noted improving hip rotation.  No reoprts of increased pain through session.     Rehab Potential Good   PT Frequency --  1-2/x week   PT Duration 4 weeks   PT Treatment/Interventions ADLs/Self Care Home Management;Cryotherapy;Moist Heat;Gait training;Neuromuscular re-education;Balance training;Therapeutic exercise;Therapeutic activities;Functional mobility training;Stair training;Patient/family education;Orthotic Fit/Training;Manual techniques;Dry needling;Passive range of motion;Taping   PT Next Visit Plan progress standing inversion/PF strength; single leg proprioception activity; check piriformis for trigger points/spasm and treat if necessary to improve hip rotator activation.    PT Home Exercise Plan inversion isometric 3" x15 reps, ankle inversion/eversion seated x15 reps, eccentric gastroc strengthening (standing) 2x10 reps       Patient will benefit from skilled therapeutic intervention in order to improve the following deficits and impairments:  Abnormal gait, Decreased activity tolerance, Decreased balance, Difficulty walking, Impaired flexibility, Increased muscle spasms, Postural dysfunction, Pain, Decreased strength, Decreased range of motion  Visit Diagnosis: Muscle weakness (generalized)  Pain in left foot  Other abnormalities of gait and mobility     Problem List There are no active problems to display for this patient.  [address] LPTA; CBIS [phone]  Juel Burrow 07/30/2016, 6:37 PM  [facility] [address] [place], [place], [address] Phone: [phone]   Fax:  [phone]  Name: Laura Tapia MRN: [id number] Date of Birth:  [birth year]

## 2016-08-03 ENCOUNTER — Ambulatory Visit
Admission: RE | Admit: 2016-08-03 | Discharge: 2016-08-03 | Disposition: A | Discharge status: Home / Self Care | Payer: 59 | Source: Ambulatory Visit | Attending: Internal Medicine | Admitting: Internal Medicine

## 2016-08-03 DIAGNOSIS — Z1231 Encounter for screening mammogram for malignant neoplasm of breast: Secondary | ICD-10-CM

## 2016-08-04 ENCOUNTER — Ambulatory Visit (HOSPITAL_COMMUNITY): Payer: 59 | Admitting: Physical Therapy

## 2016-08-04 DIAGNOSIS — M79672 Pain in left foot: Secondary | ICD-10-CM | POA: Diagnosis not present

## 2016-08-04 DIAGNOSIS — R2689 Other abnormalities of gait and mobility: Secondary | ICD-10-CM

## 2016-08-04 DIAGNOSIS — M6281 Muscle weakness (generalized): Secondary | ICD-10-CM

## 2016-08-04 NOTE — Therapy (Signed)
[facility] [address] [place], [place], [address] Phone: [phone]   Fax:  [phone]  Physical Therapy Treatment  Patient Details  Name: Laura Tapia MRN: [id number] Date of Birth: 09-18-1960 Referring Provider: Toni Arthurs, MD  Encounter Date: 08/04/2016      PT End of Session - 08/04/16 0813    Visit Number 14   Number of Visits 19   Date for PT Re-Evaluation 08/19/16   Authorization Type UHC    Authorization Time Period 06/09/16 to 07/21/16 NEW: 07/22/16 to 08/19/16   PT Start Time 0731   PT Stop Time 0813   PT Time Calculation (min) 42 min   Activity Tolerance Patient tolerated treatment well;No increased pain   Behavior During Therapy [facility] for tasks assessed/performed      No past medical history on file.  No past surgical history on file.  There were no vitals filed for this visit.      Subjective Assessment - 08/04/16 0733    Subjective Pt reports that she is a little more sore lately, but it's not bothering her too much currently. She states that this is still not as sore as it has been.    Pertinent History Being treated for RA but not any treatment    Patient Stated Goals improve foot pain    Currently in Pain? No/denies   Pain Onset More than a month ago                 [facility] Adult PT Treatment/Exercise - 08/04/16 0001      Ankle Exercises: Seated   Towel Crunch 2 reps;Other (comment)   Heel Raises 20 reps;Other (comment)  10# weight on Lt knee    Other Seated Ankle Exercises massage ball Lt arch x3 min; Lt ankle inversion with blue TB x10 reps, with green TB x15 reps, with red TB x15 reps (improved range with fatigue)     Ankle Exercises: Standing   Heel Raises 20 reps;Other (comment)  BLE on slight incline    Other Standing Ankle Exercises standing inversion on incline 2x15    Other Standing Ankle Exercises single leg reactive reaching for cones 3x15 reps, 1 finger support  wall squats with  blue TB and ankle inversion at end range squat 2x15 reps                 PT Education - 08/04/16 0757    Education provided Yes   Education Details technique with therex; encouraged pt to gradually begin walking program prior to return to work.    Person(s) Educated Patient   Methods Explanation;Tactile cues;Verbal cues   Comprehension Verbalized understanding;Returned demonstration          PT Short Term Goals - 07/16/16 1759      PT SHORT TERM GOAL #1   Title Pt will demo consistency and independence with her HEP to improve ankle strength and mobility.    Baseline 07/16/16:  Reports complaince with HEP daily   Status Achieved           PT Long Term Goals - 07/16/16 1800      PT LONG TERM GOAL #1   Title Pt will demo ankle strength to 5/5 MMT which will improve her safety with daily activity.    Status On-going     PT LONG TERM GOAL #2   Title Pt will maintain single leg balance on the Lt for atleast 15 sec, 2/3 trials, to decrease  her risk of falls and injury at work.    Baseline 07/16/16: Lt SLS 17", 21", and 8"   Status Achieved     PT LONG TERM GOAL #3   Title Pt will demo improved ankle strength and pain evident by her ability to ambulate atleast 225 ft with proper heel strike and push off pattern for increased efficiency with being on her feet all day at work.    Status On-going     PT LONG TERM GOAL #4   Title Pt will report atleast 50% improvement in her mobility and activity tolerance, to increase her performance at work during the day.     Baseline 07/16/16:  Pt reports she has improved 50% continues to have pain daily though reports reduced intensity, same frequency though has not RTW to assess standing all day               Plan - 08/04/16 0804    Clinical Impression Statement Pt continues to make steady progress with reported improvements in activity this weekend with her family. She was able to complete exercises this session without report  of pain, other than single leg eccentric calf strengthening which required minor adjustments for improved comfort. Encouraged pt to continue working on theraband exercises, specifically into inversion to address noted weakness with this. She demonstrated understanding of this. Ended without report of increased pain. Will continue with current POC.   Rehab Potential Good   PT Frequency --  1-2/x week   PT Duration 4 weeks   PT Treatment/Interventions ADLs/Self Care Home Management;Cryotherapy;Moist Heat;Gait training;Neuromuscular re-education;Balance training;Therapeutic exercise;Therapeutic activities;Functional mobility training;Stair training;Patient/family education;Orthotic Fit/Training;Manual techniques;Dry needling;Passive range of motion;Taping   PT Next Visit Plan progress ankle inversion strength in closed and open chain positions; single leg balance   PT Home Exercise Plan inversion isometric 3" x15 reps, ankle inversion/eversion seated x15 reps, eccentric gastroc strengthening (standing) 2x10 reps       Patient will benefit from skilled therapeutic intervention in order to improve the following deficits and impairments:  Abnormal gait, Decreased activity tolerance, Decreased balance, Difficulty walking, Impaired flexibility, Increased muscle spasms, Postural dysfunction, Pain, Decreased strength, Decreased range of motion  Visit Diagnosis: Muscle weakness (generalized)  Pain in left foot  Other abnormalities of gait and mobility     Problem List There are no active problems to display for this patient.  8:29 AM,08/04/16 [name] PT, DPT [facility] [phone]  [facility] [address] [place], [place], [address] Phone: [phone]   Fax:  [phone]  Name: Laura Tapia MRN: [id number] Date of Birth: 05-28-1960

## 2016-08-06 ENCOUNTER — Ambulatory Visit (HOSPITAL_COMMUNITY): Payer: 59 | Admitting: Physical Therapy

## 2016-08-06 ENCOUNTER — Telehealth (HOSPITAL_COMMUNITY): Payer: Self-pay | Admitting: Physical Therapy

## 2016-08-06 DIAGNOSIS — R2689 Other abnormalities of gait and mobility: Secondary | ICD-10-CM

## 2016-08-06 DIAGNOSIS — M79672 Pain in left foot: Secondary | ICD-10-CM

## 2016-08-06 DIAGNOSIS — M6281 Muscle weakness (generalized): Secondary | ICD-10-CM

## 2016-08-06 NOTE — Telephone Encounter (Signed)
Called pt to offer earlier appointment. Encouraged her to call with any questions/concerns.   3:37 PM,08/06/16 Laura IshiharaSara Kiser PT, DPT [facility] [phone]

## 2016-08-06 NOTE — Therapy (Signed)
[facility] [address] [place], [place], [address] Phone: [phone]   Fax:  [phone]  Physical Therapy Treatment  Patient Details  Name: Alphonsa Overallmanda L Haupt MRN: [id number] Date of Birth: 17-Jun-1960 Referring Provider: Toni ArthursJohn Hewitt, MD  Encounter Date: 08/06/2016      PT End of Session - 08/06/16 1732    Visit Number 15   Number of Visits 19   Date for PT Re-Evaluation 08/19/16   Authorization Type UHC    Authorization Time Period 06/09/16 to 07/21/16 NEW: 07/22/16 to 08/19/16   PT Start Time 1646   PT Stop Time 1728   PT Time Calculation (min) 42 min   Activity Tolerance Patient tolerated treatment well;No increased pain   [facility] for tasks assessed/performed      No past medical history on file.  No past surgical history on file.  There were no vitals filed for this visit.      Subjective Assessment - 08/06/16 1647    Subjective Pt reports that she has been up on her feet all day. She has some soreness but nothing too bad.    Pertinent History Being treated for RA but not any treatment    Patient Stated Goals improve foot pain    Currently in Pain? Yes   Pain Score 4    Pain Location Foot   Pain Orientation Left;Medial   Pain Descriptors / Indicators Aching;Sore   Pain Radiating Towards none    Pain Onset More than a month ago   Pain Frequency Intermittent   Aggravating Factors  being on feet    Pain Relieving Factors ice and rest                          [facility] Adult PT Treatment/Exercise - 08/06/16 0001      Manual Therapy   Manual Therapy Soft tissue mobilization   Manual therapy comments Manual complete separate rest of tx   Joint Mobilization Grade IV AP talocrural mobs, LLE   Soft tissue mobilization STM along Lt lateral gastroc/soleus; Lt proximal post tib tendon and abductor hallicus      Ankle Exercises: Seated   Other Seated Ankle Exercises Lt eccentric inversion x20  reps, concerntric Lt ankle inversion 2x15 reps (double red TB)      Ankle Exercises: Standing   Heel Raises Other (comment);20 reps  LLE eccentric on step    Other Standing Ankle Exercises BUE pressdown with red TB and Rt hip flexion 3x10 reps      Ankle Exercises: Stretches   Soleus Stretch 2 reps;30 seconds;Other (comment)  against wall LLE only    Gastroc Stretch 2 reps;30 seconds   Other Stretch standing against wall, LLE only      Ankle Exercises: Machines for Strengthening   Cybex Leg Press L34, BLE on dyna disc x20 reps (blue TB around knees); LLE only on foam pad 2x15 reps                 PT Education - 08/06/16 1731    Education provided Yes   Education Details Technique with therex; discussed decrease in PT frequency now that pt will be starting back at work next week    Person(s) Educated Patient   Methods Explanation;Verbal cues   Comprehension Verbalized understanding;Returned demonstration          PT Short Term Goals - 07/16/16 1759      PT SHORT  TERM GOAL #1   Title Pt will demo consistency and independence with her HEP to improve ankle strength and mobility.    Baseline 07/16/16:  Reports complaince with HEP daily   Status Achieved           PT Long Term Goals - 07/16/16 1800      PT LONG TERM GOAL #1   Title Pt will demo ankle strength to 5/5 MMT which will improve her safety with daily activity.    Status On-going     PT LONG TERM GOAL #2   Title Pt will maintain single leg balance on the Lt for atleast 15 sec, 2/3 trials, to decrease her risk of falls and injury at work.    Baseline 07/16/16: Lt SLS 17", 21", and 8"   Status Achieved     PT LONG TERM GOAL #3   Title Pt will demo improved ankle strength and pain evident by her ability to ambulate atleast 225 ft with proper heel strike and push off pattern for increased efficiency with being on her feet all day at work.    Status On-going     PT LONG TERM GOAL #4   Title Pt will report  atleast 50% improvement in her mobility and activity tolerance, to increase her performance at work during the day.     Baseline 07/16/16:  Pt reports she has improved 50% continues to have pain daily though reports reduced intensity, same frequency though has not RTW to assess standing all day               Plan - 08/06/16 1732    Clinical Impression Statement Pt arrived with some soreness following a day of heavy activity around the home. Session focused on progression of ankle inversion strength via both eccentric and concentric therex. Also completed manual techniques to address muscle spasm and trigger points along the lateral aspect of the Lt gastroc soleus complex. Therapist discussed decreasing to 1x/week with pt secondary to increasing independence with exercise at home. She verbalized agreement with this and ended session without reported increase in her symptoms/pain.    Rehab Potential Good   PT Frequency --  1-2/x week   PT Duration 4 weeks   PT Treatment/Interventions ADLs/Self Care Home Management;Cryotherapy;Moist Heat;Gait training;Neuromuscular re-education;Balance training;Therapeutic exercise;Therapeutic activities;Functional mobility training;Stair training;Patient/family education;Orthotic Fit/Training;Manual techniques;Dry needling;Passive range of motion;Taping   PT Next Visit Plan standing ankle inversion; possible TPDN to gastros/soleus; single leg balance activity/BAPS   PT Home Exercise Plan inversion isometric 3" x15 reps, ankle inversion/eversion seated x15 reps, eccentric gastroc strengthening (standing) 2x10 reps    Consulted and Agree with Plan of Care Patient      Patient will benefit from skilled therapeutic intervention in order to improve the following deficits and impairments:  Abnormal gait, Decreased activity tolerance, Decreased balance, Difficulty walking, Impaired flexibility, Increased muscle spasms, Postural dysfunction, Pain, Decreased strength,  Decreased range of motion  Visit Diagnosis: Muscle weakness (generalized)  Pain in left foot  Other abnormalities of gait and mobility     Problem List There are no active problems to display for this patient.    5:36 PM,08/06/16 [name] PT, DPT [facility] [phone]  [facility] [address] [place], [place], [address] Phone: [phone]   Fax:  [phone]  Name: Alphonsa Overallmanda L Hoheisel MRN: [id number] Date of Birth: [birth year]

## 2016-08-11 ENCOUNTER — Ambulatory Visit (HOSPITAL_COMMUNITY): Payer: 59 | Admitting: Physical Therapy

## 2016-08-11 DIAGNOSIS — M79672 Pain in left foot: Secondary | ICD-10-CM

## 2016-08-11 DIAGNOSIS — M6281 Muscle weakness (generalized): Secondary | ICD-10-CM

## 2016-08-11 DIAGNOSIS — R2689 Other abnormalities of gait and mobility: Secondary | ICD-10-CM

## 2016-08-11 NOTE — Therapy (Signed)
[place] [facility] [address] [place], [place], [address] Phone: [phone]   Fax:  [phone]  Physical Therapy Treatment  Patient Details  Name: Laura Tapia L Weichert MRN: [id number] Date of Birth: Nov 23, 1960 Referring Provider: Toni ArthursJohn Hewitt, MD  Encounter Date: 08/11/2016      PT End of Session - 08/11/16 0740    Visit Number 16   Number of Visits 19   Date for PT Re-Evaluation 08/19/16   Authorization Type UHC    Authorization Time Period 06/09/16 to 07/21/16 NEW: 07/22/16 to 08/19/16   PT Start Time 0731   PT Stop Time 0809   PT Time Calculation (min) 38 min   Activity Tolerance Patient tolerated treatment well;No increased pain   Behavior During Therapy [facility] for tasks assessed/performed      No past medical history on file.  No past surgical history on file.  There were no vitals filed for this visit.      Subjective Assessment - 08/11/16 0734    Subjective Pt reports that things are going well. She did have some bruising along her calf following the manual techniques last session. She reports that the inside of her foot is doing well though.    Currently in Pain? No/denies                         [facility]/Exercise - 08/11/16 0001      Ankle Exercises: Standing   BAPS Standing;Other (comment)  Lt tandem stance x2 min clockwise/counterclockwise    Balance Beam x4 RT heel/toe on foam beam    Other Standing Ankle Exercises Standing Lt ankle inversion on incline surface 3x10 reps    Other Standing Ankle Exercises BUE pressdown with red TB and Rt hip flexion 3x10 reps on foam pad      Ankle Exercises: Seated   Other Seated Ankle Exercises Lt concentric 3x10 reps with double green TB, Lt eccentric inversion 3x10 reps with double green T                 PT Education - 08/11/16 0809    Education provided Yes   Education Details addition to HEP and reviewed technique; encouraged to monitor how  her ankle feels once she returns to work and complete less sets/reps as needed   Person(s) Educated Patient   Methods Explanation;Verbal cues;Handout   Comprehension Verbalized understanding;Returned demonstration          PT Short Term Goals - 07/16/16 1759      PT SHORT TERM GOAL #1   Title Pt will demo consistency and independence with her HEP to improve ankle strength and mobility.    Baseline 07/16/16:  Reports complaince with HEP daily   Status Achieved           PT Long Term Goals - 07/16/16 1800      PT LONG TERM GOAL #1   Title Pt will demo ankle strength to 5/5 MMT which will improve her safety with daily activity.    Status On-going     PT LONG TERM GOAL #2   Title Pt will maintain single leg balance on the Lt for atleast 15 sec, 2/3 trials, to decrease her risk of falls and injury at work.    Baseline 07/16/16: Lt SLS 17", 21", and 8"   Status Achieved     PT LONG TERM GOAL #3   Title Pt will demo improved ankle strength and  pain evident by her ability to ambulate atleast 225 ft with proper heel strike and push off pattern for increased efficiency with being on her feet all day at work.    Status On-going     PT LONG TERM GOAL #4   Title Pt will report atleast 50% improvement in her mobility and activity tolerance, to increase her performance at work during the day.     Baseline 07/16/16:  Pt reports she has improved 50% continues to have pain daily though reports reduced intensity, same frequency though has not RTW to assess standing all day               Plan - 08/11/16 0741    Clinical Impression Statement Pt arrived today with small bruises along her gastroc region following last session. No other reports of pain or symptoms with the medial aspect of her foot despite this. Continued with therex to improve ankle strength and neuromuscular control. Pt able to complete exercises with less report of pain overall and with improved technique from previous  sessions. Ended without reported increase in pain. Therapist encouraged pt to adjust HEP as needed over these next couple of days with her return to work and made an update to her HEP to reflect improvements today with therex performance. She verbalized understanding and reported no increase in pain by the end of today's session.   Rehab Potential Good   PT Frequency --  1-2/x week   PT Duration 4 weeks   PT Treatment/Interventions ADLs/Self Care Home Management;Cryotherapy;Moist Heat;Gait training;Neuromuscular re-education;Balance training;Therapeutic exercise;Therapeutic activities;Functional mobility training;Stair training;Patient/family education;Orthotic Fit/Training;Manual techniques;Dry needling;Passive range of motion;Taping   PT Next Visit Plan standing ankle inversion on single leg; possible self rolling to gastros/soleus   PT Home Exercise Plan standing ankle inversion 3x10 reps, ankle inversion/eversion seated x15 reps, eccentric gastroc strengthening (standing) 2x10 reps    Consulted and Agree with Plan of Care Patient      Patient will benefit from skilled therapeutic intervention in order to improve the following deficits and impairments:  Abnormal gait, Decreased activity tolerance, Decreased balance, Difficulty walking, Impaired flexibility, Increased muscle spasms, Postural dysfunction, Pain, Decreased strength, Decreased range of motion  Visit Diagnosis: Muscle weakness (generalized)  Pain in left foot  Other abnormalities of gait and mobility     Problem List There are no active problems to display for this patient.    8:14 AM,08/11/16 [name] PT, DPT [facility] [phone]  [facility] [address] [place], [place], [address] Phone: [phone]   Fax:  [phone]  Name: Laura Tapia MRN: [id number] Date of Birth: 1960/04/12

## 2016-08-13 ENCOUNTER — Ambulatory Visit (HOSPITAL_COMMUNITY): Payer: 59 | Admitting: Physical Therapy

## 2016-08-18 ENCOUNTER — Ambulatory Visit (HOSPITAL_COMMUNITY): Payer: 59 | Attending: Orthopedic Surgery | Admitting: Physical Therapy

## 2016-08-18 DIAGNOSIS — R2689 Other abnormalities of gait and mobility: Secondary | ICD-10-CM | POA: Insufficient documentation

## 2016-08-18 DIAGNOSIS — M6281 Muscle weakness (generalized): Secondary | ICD-10-CM | POA: Insufficient documentation

## 2016-08-18 DIAGNOSIS — M79672 Pain in left foot: Secondary | ICD-10-CM | POA: Diagnosis not present

## 2016-08-18 NOTE — Therapy (Signed)
[place] [facility] [address] [place], [place], [address] Phone: [phone]   Fax:  [phone]  Physical Therapy Treatment  Patient Details  Name: Laura Tapia MRN: [id number] Date of Birth: 08-02-1960 Referring Provider: Toni Arthurs, MD  Encounter Date: 08/18/2016      PT End of Session - 08/18/16 0749    Visit Number 17   Number of Visits 19   Date for PT Re-Evaluation 08/25/16   Authorization Type UHC    Authorization Time Period 06/09/16 to 07/21/16 NEW: 07/22/16 to 08/25/16   PT Start Time 0731   PT Stop Time 0811   PT Time Calculation (min) 40 min   Activity Tolerance Patient tolerated treatment well;No increased pain   Behavior During Therapy [facility] for tasks assessed/performed      No past medical history on file.  No past surgical history on file.  There were no vitals filed for this visit.      Subjective Assessment - 08/18/16 0732    Subjective Pt reports that things conitnue to go well. She did well at work this past week on light duty and states that they plan on slowly increasing her work load over the next couple of weeks.    Currently in Pain? No/denies                         [facility] Adult PT Treatment/Exercise - 08/18/16 0001      Ankle Exercises: Standing   BAPS Standing;Other (comment)  Lt CW/CCW x2 min each; Ant/post tap x1 min    Vector Stance 10 seconds;Left;Other (comment);5 reps   Heel Raises 15 reps;Other (comment)  x2 sets on steps, BLE concentric, LLE eccentric    Other Standing Ankle Exercises single leg Lt ankle inversion on flat surface 2x15 reps, BUE support    Other Standing Ankle Exercises BUE pressdown with green TB and Rt hip flexion 2x15 reps on firm, 2x15 reps on foam; half kneel to stand with LLE  forward x10 reps     Ankle Exercises: Supine   Other Supine Ankle Exercises Lt gastroc rolling with foam roller                 PT Education - 08/18/16 0748    Education provided Yes   Education Details technique with therex; upcoming re-evaluation to determine need for d/c or extension of POC   Person(s) Educated Patient   Methods Explanation;Verbal cues   Comprehension Verbalized understanding;Returned demonstration          PT Short Term Goals - 07/16/16 1759      PT SHORT TERM GOAL #1   Title Pt will demo consistency and independence with her HEP to improve ankle strength and mobility.    Baseline 07/16/16:  Reports complaince with HEP daily   Status Achieved           PT Long Term Goals - 07/16/16 1800      PT LONG TERM GOAL #1   Title Pt will demo ankle strength to 5/5 MMT which will improve her safety with daily activity.    Status On-going     PT LONG TERM GOAL #2   Title Pt will maintain single leg balance on the Lt for atleast 15 sec, 2/3 trials, to decrease her risk of falls and injury at work.    Baseline 07/16/16: Lt SLS 17", 21", and 8"   Status Achieved     PT LONG  TERM GOAL #3   Title Pt will demo improved ankle strength and pain evident by her ability to ambulate atleast 225 ft with proper heel strike and push off pattern for increased efficiency with being on her feet all day at work.    Status On-going     PT LONG TERM GOAL #4   Title Pt will report atleast 50% improvement in her mobility and activity tolerance, to increase her performance at work during the day.     Baseline 07/16/16:  Pt reports she has improved 50% continues to have pain daily though reports reduced intensity, same frequency though has not RTW to assess standing all day               Plan - 08/18/16 0811    Clinical Impression Statement Pt continues to make good progress towards her goals with improving strength and proprioception. Session continued with activity to promote ankle endurance, noting improvements in her ability to control the BAPS board and report of no pain by end of session. Pt has a re-evaluation coming up next session to  determine plan for discharge. She verbalized understanding of this.    Rehab Potential Good   PT Frequency --  1-2/x week   PT Duration 4 weeks   PT Treatment/Interventions ADLs/Self Care Home Management;Cryotherapy;Moist Heat;Gait training;Neuromuscular re-education;Balance training;Therapeutic exercise;Therapeutic activities;Functional mobility training;Stair training;Patient/family education;Orthotic Fit/Training;Manual techniques;Dry needling;Passive range of motion;Taping   PT Next Visit Plan reassess and possible d/c    PT Home Exercise Plan standing ankle inversion 3x10 reps, ankle inversion/eversion seated x15 reps, eccentric gastroc strengthening (standing) 2x10 reps, single leg vectors   Consulted and Agree with Plan of Care Patient      Patient will benefit from skilled therapeutic intervention in order to improve the following deficits and impairments:  Abnormal gait, Decreased activity tolerance, Decreased balance, Difficulty walking, Impaired flexibility, Increased muscle spasms, Postural dysfunction, Pain, Decreased strength, Decreased range of motion  Visit Diagnosis: Muscle weakness (generalized)  Pain in left foot  Other abnormalities of gait and mobility     Problem List There are no active problems to display for this patient.    8:19 AM,08/18/16 Laura IshiharaSara Tapia PT, DPT [facility] [phone]  [facility] [address] [place], [place], [address] Phone: [phone]   Fax:  [phone]  Name: Laura Tapia MRN: [id number] Date of Birth: 1960/03/09

## 2016-08-20 ENCOUNTER — Encounter (HOSPITAL_COMMUNITY): Payer: 59 | Admitting: Physical Therapy

## 2016-08-25 ENCOUNTER — Ambulatory Visit (HOSPITAL_COMMUNITY): Payer: 59 | Admitting: Physical Therapy

## 2016-08-25 ENCOUNTER — Telehealth (HOSPITAL_COMMUNITY): Payer: Self-pay | Admitting: Internal Medicine

## 2016-08-25 DIAGNOSIS — M7750 Other enthesopathy of unspecified foot: Secondary | ICD-10-CM | POA: Diagnosis not present

## 2016-08-25 DIAGNOSIS — G4700 Insomnia, unspecified: Secondary | ICD-10-CM | POA: Diagnosis not present

## 2016-08-25 NOTE — Telephone Encounter (Signed)
08/25/16  left a message that she woke up with an upset stomach and won't be here today

## 2016-08-27 ENCOUNTER — Encounter (HOSPITAL_COMMUNITY): Payer: 59 | Admitting: Physical Therapy

## 2016-09-01 ENCOUNTER — Ambulatory Visit (HOSPITAL_COMMUNITY): Payer: 59 | Admitting: Physical Therapy

## 2016-09-01 DIAGNOSIS — R2689 Other abnormalities of gait and mobility: Secondary | ICD-10-CM

## 2016-09-01 DIAGNOSIS — M79672 Pain in left foot: Secondary | ICD-10-CM

## 2016-09-01 DIAGNOSIS — M6281 Muscle weakness (generalized): Secondary | ICD-10-CM

## 2016-09-01 NOTE — Therapy (Signed)
[place], [place], [address] Phone: [phone]   Fax:  [phone]  Physical Therapy Treatment/Discharge  Patient Details  Name: Laura Tapia MRN: [id number] Date of Birth: 16-Mar-1960 Referring Provider: Wylene Simmer, MD   Encounter Date: 09/01/2016      PT End of Session - 09/01/16 0814    Visit Number 18   Number of Visits 19   Date for PT Re-Evaluation 08/25/16   Authorization Type U[employer]    Authorization Time Period 08/26/16 to 09/01/16   PT Start Time 0732   PT Stop Time 0812   PT Time Calculation (min) 40 min   Activity Tolerance Patient tolerated treatment well;No increased pain   Behavior During Therapy [facility] for tasks assessed/performed      No past medical history on file.  No past surgical history on file.  There were no vitals filed for this visit.      Subjective Assessment - 09/01/16 0735    Subjective Pt reports that things are going well. She has been at full work load for a little while and states that her foot is only usually bothering her some by the end of the day. She is still keeping up with her exercises as well. Overall, she feels that she is about 60-70% improved   Pertinent History Being treated for RA but not any treatment    How long can you sit comfortably? unlimited   How long can you stand comfortably? 6 hour    How long can you walk comfortably? 6 hours   Diagnostic tests MRI and foot X-ray negative    Patient Stated Goals continue to improve   Currently in Pain? 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      Cognition   Overall Cognitive Status Within Functional Limits for tasks assessed     Observation/Other Assessments   Focus on Therapeutic Outcomes (FOTO)  11% limited      AROM   Right Ankle Dorsiflexion 4   Right Ankle Plantar Flexion 54   Right Ankle Inversion 28   Right Ankle Eversion 20   Left Ankle Dorsiflexion 10   Left Ankle Plantar Flexion 52   Left Ankle Inversion 30     Strength   Right Hip Flexion 5/5   Right Hip Extension 4+/5   Right Hip ABduction 5/5   Left Hip Flexion 5/5   Left Hip Extension 4+/5   Left Hip ABduction 4+/5   Right Knee Flexion 5/5   Right Knee Extension 5/5   Left Knee Flexion 5/5   Left Knee Extension 5/5   Right Ankle Dorsiflexion 5/5   Right Ankle Plantar Flexion 5/5   Right Ankle Inversion 5/5   Right Ankle Eversion 5/5   Left Ankle Dorsiflexion 5/5   Left Ankle Plantar Flexion 4+/5  pain free   Left Ankle Inversion 5/5   Left Ankle Eversion 5/5     Palpation  Palpation comment tenderness along medial navicular region along the posterior tibial tendon but much improved     Ambulation/Gait   Gait Comments (+) pronation on the Lt, decreased push      High Level Balance   High Level Balance Comments SLS: Rt 20+ sec, Lt 10 sec                              PT Education - 09/01/16 0814    Education provided Yes   Education Details reviewed goals and updated/reviewed progressions with HEP to allow pt to continue addressing limitations at home.    Person(s) Educated Patient   Methods Explanation;Verbal cues;Handout   Comprehension Verbalized understanding          PT Short Term Goals - 09/01/16 0750      PT SHORT TERM GOAL #1   Title Pt will demo consistency and independence with her HEP to improve ankle strength and mobility.    Baseline 07/16/16:  Reports complaince with HEP daily   Status Achieved           PT Long Term Goals - 09/01/16 0750      PT LONG TERM  GOAL #1   Title Pt will demo ankle strength to 5/5 MMT which will improve her safety with daily activity.    Status Achieved     PT LONG TERM GOAL #2   Title Pt will maintain single leg balance on the Lt for atleast 15 sec, 2/3 trials, to decrease her risk of falls and injury at work.    Baseline 07/16/16: Lt SLS 17", 21", and 8"; 10 sec at re-evaluation    Status Achieved     PT LONG TERM GOAL #3   Title Pt will demo improved ankle strength and pain evident by her ability to ambulate atleast 225 ft with proper heel strike and push off pattern for increased efficiency with being on her feet all day at work.    Status Achieved     PT LONG TERM GOAL #4   Title Pt will report atleast 50% improvement in her mobility and activity tolerance, to increase her performance at work during the day.     Baseline 60-70% improved per pt report    Status Achieved               Plan - 09/01/16 1057    Clinical Impression Statement Pt was discharged this visit having met all of her short and long term goals. She reports feeling 70% improved since the start of PT, and demonstrates ankle ROM that is WNL on the Lt, full ankle strength and significantly improved proprioception and ankle reactions during single leg stance. She has returned to full duty at work over the past couple of weeks, and reports only minimal complaints of soreness following her busy days. She has been consistently completing her HEP with good relief and is agreement with d/c at this time to allow her to independently continue with her HEP for further improvements. Therapist reviewed and updated her advanced HEP and she verbalized understanding of all areas discussed.    Rehab Potential Good   PT Frequency --  1-2/x week   PT Duration 4 weeks   PT Treatment/Interventions ADLs/Self Care Home Management;Cryotherapy;Moist Heat;Gait training;Neuromuscular re-education;Balance training;Therapeutic exercise;Therapeutic activities;Functional  mobility training;Stair training;Patient/family education;Orthotic Fit/Training;Manual techniques;Dry needling;Passive range of motion;Taping   PT Next Visit Plan d/c home with HEP   PT Home  Exercise Plan standing ankle inversion 3x10 reps, ankle inversion/eversion seated x15 reps, eccentric gastroc strengthening (standing) 2x10 reps, single leg vectors   Consulted and Agree with Plan of Care Patient      Patient will benefit from skilled therapeutic intervention in order to improve the following deficits and impairments:  Abnormal gait, Decreased activity tolerance, Decreased balance, Difficulty walking, Impaired flexibility, Increased muscle spasms, Postural dysfunction, Pain, Decreased strength, Decreased range of motion  Visit Diagnosis: Muscle weakness (generalized) - Plan: PT plan of care cert/re-cert  Pain in left foot - Plan: PT plan of care cert/re-cert  Other abnormalities of gait and mobility - Plan: PT plan of care cert/re-cert  PHYSICAL THERAPY DISCHARGE SUMMARY  Visits from Start of Care: 18  Current functional level related to goals / functional outcomes: See above for more details    Remaining deficits: See above for more details    Education / Equipment: See above for more details  Plan: Patient agrees to discharge.  Patient goals were met. Patient is being discharged due to meeting the stated rehab goals.  ?????        Problem List There are no active problems to display for this patient.  11:06 AM,09/01/16 Elly Modena PT, DPT [facility] [place] [address] [place], [place], [address] Phone: [phone]   Fax:  [phone]  Name: ROWENA MOILANEN MRN: [id number] Date of Birth: 12-18-1960

## 2016-09-03 ENCOUNTER — Encounter (HOSPITAL_COMMUNITY): Payer: 59 | Admitting: Physical Therapy

## 2016-09-08 ENCOUNTER — Ambulatory Visit (HOSPITAL_COMMUNITY): Payer: 59 | Admitting: Physical Therapy

## 2016-09-10 ENCOUNTER — Encounter (HOSPITAL_COMMUNITY): Payer: 59 | Admitting: Physical Therapy

## 2016-09-18 DIAGNOSIS — M459 Ankylosing spondylitis of unspecified sites in spine: Secondary | ICD-10-CM | POA: Diagnosis not present

## 2016-09-18 DIAGNOSIS — M2550 Pain in unspecified joint: Secondary | ICD-10-CM | POA: Diagnosis not present

## 2016-09-18 DIAGNOSIS — M150 Primary generalized (osteo)arthritis: Secondary | ICD-10-CM | POA: Diagnosis not present

## 2016-10-13 DIAGNOSIS — M47816 Spondylosis without myelopathy or radiculopathy, lumbar region: Secondary | ICD-10-CM | POA: Diagnosis not present

## 2016-11-03 DIAGNOSIS — M459 Ankylosing spondylitis of unspecified sites in spine: Secondary | ICD-10-CM | POA: Diagnosis not present

## 2016-11-03 DIAGNOSIS — M150 Primary generalized (osteo)arthritis: Secondary | ICD-10-CM | POA: Diagnosis not present

## 2016-11-03 DIAGNOSIS — M533 Sacrococcygeal disorders, not elsewhere classified: Secondary | ICD-10-CM | POA: Diagnosis not present

## 2016-11-24 DIAGNOSIS — M1711 Unilateral primary osteoarthritis, right knee: Secondary | ICD-10-CM | POA: Diagnosis not present

## 2016-11-24 DIAGNOSIS — M170 Bilateral primary osteoarthritis of knee: Secondary | ICD-10-CM | POA: Diagnosis not present

## 2016-11-24 DIAGNOSIS — M1712 Unilateral primary osteoarthritis, left knee: Secondary | ICD-10-CM | POA: Diagnosis not present

## 2016-12-15 DIAGNOSIS — Z0000 Encounter for general adult medical examination without abnormal findings: Secondary | ICD-10-CM | POA: Diagnosis not present

## 2016-12-15 DIAGNOSIS — G4700 Insomnia, unspecified: Secondary | ICD-10-CM | POA: Diagnosis not present

## 2016-12-15 DIAGNOSIS — M7750 Other enthesopathy of unspecified foot: Secondary | ICD-10-CM | POA: Diagnosis not present

## 2016-12-24 ENCOUNTER — Ambulatory Visit: Payer: 59 | Admitting: Podiatry

## 2016-12-31 ENCOUNTER — Ambulatory Visit (INDEPENDENT_AMBULATORY_CARE_PROVIDER_SITE_OTHER): Payer: 59

## 2016-12-31 ENCOUNTER — Ambulatory Visit (INDEPENDENT_AMBULATORY_CARE_PROVIDER_SITE_OTHER): Payer: 59 | Admitting: Podiatry

## 2016-12-31 ENCOUNTER — Encounter: Payer: Self-pay | Admitting: Podiatry

## 2016-12-31 DIAGNOSIS — M779 Enthesopathy, unspecified: Secondary | ICD-10-CM

## 2016-12-31 MED ORDER — TRIAMCINOLONE ACETONIDE 10 MG/ML IJ SUSP
10.0000 mg | Freq: Once | INTRAMUSCULAR | Status: AC
  Administered 2016-12-31: 10 mg

## 2016-12-31 NOTE — Patient Instructions (Signed)

## 2016-12-31 NOTE — Progress Notes (Signed)
Subjective:   Patient ID: Laura OverallAmanda L Tapia, female   DOB: 56 y.o.   MRN: [id number]   HPI Patient presents with pain in the left ankle that has been present for about a year and she has had previous MRI that indicated tendinitis and she has had previous soft orthotics and has had previous physical therapy without significant relief   ROS      Objective:  Physical Exam  Neurovascular status intact with inflammation of the posterior tibial tendon left with moderate depression of the arch noted and pain mostly at its insertion into the navicular     Assessment:  Tendinitis of the posterior tibial with structural changes to the arch as part of the precipitating problem with moderate enlargement of the navicular     Plan:  H&P condition reviewed x-rays reviewed with patient.  Today careful she did injection administered near the insertion navicular and applied a below the knee air fracture walker to mobilize and discussed a more rigid orthotic.  Reappoint 3 weeks to reevaluate  X-rays indicate there is moderate depression of the arch with enlargement of the navicular left

## 2017-01-01 ENCOUNTER — Ambulatory Visit: Payer: 59 | Admitting: Podiatry

## 2017-01-25 ENCOUNTER — Ambulatory Visit: Payer: 59 | Admitting: Podiatry

## 2017-01-26 DIAGNOSIS — M47816 Spondylosis without myelopathy or radiculopathy, lumbar region: Secondary | ICD-10-CM | POA: Diagnosis not present

## 2017-01-29 ENCOUNTER — Ambulatory Visit: Payer: 59 | Admitting: Podiatry

## 2017-02-03 DIAGNOSIS — M459 Ankylosing spondylitis of unspecified sites in spine: Secondary | ICD-10-CM | POA: Diagnosis not present

## 2017-03-09 DIAGNOSIS — M2550 Pain in unspecified joint: Secondary | ICD-10-CM | POA: Diagnosis not present

## 2017-03-09 DIAGNOSIS — M064 Inflammatory polyarthropathy: Secondary | ICD-10-CM | POA: Diagnosis not present

## 2017-03-09 DIAGNOSIS — M150 Primary generalized (osteo)arthritis: Secondary | ICD-10-CM | POA: Diagnosis not present

## 2017-04-05 ENCOUNTER — Ambulatory Visit: Payer: 59 | Admitting: Podiatry

## 2017-04-05 DIAGNOSIS — M76822 Posterior tibial tendinitis, left leg: Secondary | ICD-10-CM | POA: Diagnosis not present

## 2017-04-05 NOTE — Progress Notes (Signed)
   HPI: 57 year old female presents the office today for another opinion regarding left foot pain.  Patient states that she is experience the pain since 2017.  Patient does not recall any injury however she states that she noticed some pain to the inside of her arch back in 2017.  Patient was seen by orthopedist who eventually ordered an MRI on October 2017.  MRI results were noncontributory to any significant findings.  Patient is gone through multiple conservative modalities including immobilization, rest, injection of anti-inflammatory steroid, orthotics which have not helped to alleviate any of her symptoms.  Pain is been ongoing for approximately 1-1/2 years now.  She presents today for another opinion.  No past medical history on file.   Physical Exam: General: The patient is alert and oriented x3 in no acute distress.  Dermatology: Skin is warm, dry and supple bilateral lower extremities. Negative for open lesions or macerations.  Vascular: Palpable pedal pulses bilaterally. No edema or erythema noted. Capillary refill within normal limits.  Neurological: Epicritic and protective threshold grossly intact bilaterally.   Musculoskeletal Exam: Significant amount of pain on palpation at the navicular tubercle at the insertion of the posterior tibial tendon.  Range of motion within normal limits to all pedal and ankle joints bilateral. Muscle strength 5/5 in all groups bilateral.   Radiographs taken 12/31/2016 do indicate a enlarged navicular tuberosity.  Assessment: -Insertional posterior tibial tendinitis left-chronic -Possible rheumatoid arthritis as per rheumatologist -Ankylosing spondylitis   Plan of Care:  - Patient evaluated. X-Rays reviewed from 12/31/2016.  -Today we are going to order a new MRI left foot.  The patient has failed all conservative modalities and has experienced the pain for the past year and a half.  The patient has not improved despite conservative treatments  including Zickel therapy, steroid anti-inflammatory injections, immobilization in a cam boot, rest. -Continue meloxicam from rheumatologist -Return to clinic in 4 weeks to review MRI results    Felecia ShellingBrent M. Meagan Ancona, DPM [facility]  Dr. Felecia ShellingBrent M. Merrill Villarruel, DPM    [address]                                        , [place] [address]                Office [phone]  Fax [phone]

## 2017-04-06 ENCOUNTER — Telehealth: Payer: Self-pay | Admitting: *Deleted

## 2017-04-06 DIAGNOSIS — M76822 Posterior tibial tendinitis, left leg: Secondary | ICD-10-CM

## 2017-04-06 NOTE — Telephone Encounter (Signed)
-----   Message from Felecia ShellingBrent M Evans, DPM sent at 04/05/2017  6:39 PM EDT ----- Regarding: MRI left foot Please order MRI left foot with or without contrast  Dx: Insertional posterior tibial tendinitis/tendinosis at the navicular tuberosity -chronic  Thanks, Dr. Logan BoresEvans

## 2017-04-16 ENCOUNTER — Ambulatory Visit
Admission: RE | Admit: 2017-04-16 | Discharge: 2017-04-16 | Disposition: A | Discharge status: Home / Self Care | Payer: 59 | Source: Ambulatory Visit | Attending: Podiatry | Admitting: Podiatry

## 2017-04-16 DIAGNOSIS — R600 Localized edema: Secondary | ICD-10-CM | POA: Diagnosis not present

## 2017-04-20 DIAGNOSIS — M47816 Spondylosis without myelopathy or radiculopathy, lumbar region: Secondary | ICD-10-CM | POA: Diagnosis not present

## 2017-04-28 ENCOUNTER — Ambulatory Visit: Payer: 59 | Admitting: Podiatry

## 2017-05-03 ENCOUNTER — Ambulatory Visit: Payer: 59 | Admitting: Podiatry

## 2017-05-05 ENCOUNTER — Ambulatory Visit: Payer: 59 | Admitting: Podiatry

## 2017-05-05 ENCOUNTER — Telehealth: Payer: Self-pay | Admitting: *Deleted

## 2017-05-05 DIAGNOSIS — M76822 Posterior tibial tendinitis, left leg: Secondary | ICD-10-CM

## 2017-05-05 NOTE — Patient Instructions (Signed)
Pre-Operative Instructions  Congratulations, you have decided to take an important step towards improving your quality of life.  You can be assured that the doctors and staff at [facility] will be with you every step of the way.  Here are some important things you should know:  1. Plan to be at the surgery center/hospital at least 1 (one) hour prior to your scheduled time, unless otherwise directed by the surgical center/hospital staff.  You must have a responsible adult accompany you, remain during the surgery and drive you home.  Make sure you have directions to the surgical center/hospital to ensure you arrive on time. 2. If you are having surgery at [facility] or [facility], you will need a copy of your medical history and physical form from your family physician within one month prior to the date of surgery. We will give you a form for your primary physician to complete.  3. We make every effort to accommodate the date you request for surgery.  However, there are times where surgery dates or times have to be moved.  We will contact you as soon as possible if a change in schedule is required.   4. No aspirin/ibuprofen for one week before surgery.  If you are on aspirin, any non-steroidal anti-inflammatory medications (Mobic, Aleve, Ibuprofen) should not be taken seven (7) days prior to your surgery.  You make take Tylenol for pain prior to surgery.  5. Medications - If you are taking daily heart and blood pressure medications, seizure, reflux, allergy, asthma, anxiety, pain or diabetes medications, make sure you notify the surgery center/hospital before the day of surgery so they can tell you which medications you should take or avoid the day of surgery. 6. No food or drink after midnight the night before surgery unless directed otherwise by surgical center/hospital staff. 7. No alcoholic beverages 24-hours prior to surgery.  No smoking 24-hours prior or 24-hours after  surgery. 8. Wear loose pants or shorts. They should be loose enough to fit over bandages, boots, and casts. 9. Don't wear slip-on shoes. Sneakers are preferred. 10. Bring your boot with you to the surgery center/hospital.  Also bring crutches or a walker if your physician has prescribed it for you.  If you do not have this equipment, it will be provided for you after surgery. 11. If you have not been contacted by the surgery center/hospital by the day before your surgery, call to confirm the date and time of your surgery. 12. Leave-time from work may vary depending on the type of surgery you have.  Appropriate arrangements should be made prior to surgery with your employer. 13. Prescriptions will be provided immediately following surgery by your doctor.  Fill these as soon as possible after surgery and take the medication as directed. Pain medications will not be refilled on weekends and must be approved by the doctor. 14. Remove nail polish on the operative foot and avoid getting pedicures prior to surgery. 15. Wash the night before surgery.  The night before surgery wash the foot and leg well with water and the antibacterial soap provided. Be sure to pay special attention to beneath the toenails and in between the toes.  Wash for at least three (3) minutes. Rinse thoroughly with water and dry well with a towel.  Perform this wash unless told not to do so by your physician.  Enclosed: 1 Ice pack (please put in freezer the night before surgery)   1 Hibiclens skin cleaner     Pre-op instructions  If you have any questions regarding the instructions, please do not hesitate to call our office.  [place]: [address], [place], [place] [address] -- [phone]  [place]: [address] [place], [place] [address] -- [phone]  [place]: [address]  [place], [place] [address] -- [phone]  [place]: [address], [place], [place] [address] -- [phone]  Website:  [email or web address] 

## 2017-05-05 NOTE — Telephone Encounter (Signed)
"  I saw Dr. Logan BoresEvans this morning.  I'd like to schedule my surgery.  I had said [facility] to [facility] but I have changed my mind.  Can we do it at the other place?"  Yes, [facility].  Do you have a date in mind?  "I'd like to do it on June 20 if possible."  That date is available.  I will get it scheduled.  You will need to register with the surgical center.  Let them know how you would like to receive communications whether it be by phone call, email, or text message.  They will let you know your arrival time a day or two prior to your surgery date.  "What's the link I should use to register?"  It is [email or web address].  "Exactly where are they located on [address]  They are located at [address]  They are on the side that crosses [address]  "Okay, I'm familiar with that area."

## 2017-05-09 NOTE — Progress Notes (Signed)
   HPI: 57 year old female presenting today for follow up evaluation of left foot pain. She is here to review the MRI results. She denies any new complaints at this time. She states there has been no significant changes in her condition. Patient is here for further evaluation and treatment.   No past medical history on file.   Physical Exam: General: The patient is alert and oriented x3 in no acute distress.  Dermatology: Skin is warm, dry and supple bilateral lower extremities. Negative for open lesions or macerations.  Vascular: Palpable pedal pulses bilaterally. No edema or erythema noted. Capillary refill within normal limits.  Neurological: Epicritic and protective threshold grossly intact bilaterally.   Musculoskeletal Exam: Pain with palpation to the insertion of the the posterior tibial tendon of the LLE. Range of motion within normal limits to all pedal and ankle joints bilateral. Muscle strength 5/5 in all groups bilateral.   MRI Results: 1. The vitamin-E capsule indicating the site of the patient's pain corresponds most closely to the tibialis posterior tendinopathy. There is a small accessory navicular, spurring at the tibialis posterior attachment site to the navicular, and also some nearby spurring of the sustentaculum tali. Correlate clinically in assessing for tibialis posterior dysfunction. 2. There is low-level edema medially along the medial cuneiform adjacent to the tibialis anterior tendon, potentially indicating low-grade injury in this vicinity. 3. Peroneus longus and flexor hallucis longus tenosynovitis.  Assessment:  1. PT tendinopathy left 2. Navicular spurring left  Plan of Care:  1. Patient evaluated. MRI reviewed.  2. Today we discussed the conservative versus surgical management of the presenting pathology. The patient opts for surgical management. All possible complications and details of the procedure were explained. All patient questions were answered. No  guarantees were expressed or implied. 3. Authorization for surgery was initiated today. Surgery will consist of exostectomy navicular left. Repair PT tendon left.  4. Return to clinic one week post op.   CMA at [facility].      Felecia Shelling, DPM [facility]  Dr. Felecia Shelling, DPM    [address] [place], [place] [address]                Office [phone]  Fax [phone]

## 2017-05-12 ENCOUNTER — Telehealth: Payer: Self-pay | Admitting: *Deleted

## 2017-05-12 NOTE — Telephone Encounter (Signed)
Done. Thank you ma'am.

## 2017-05-12 NOTE — Telephone Encounter (Signed)
"  I'm scheduled for surgery on June 20.  I was wondering if I could move it to the following week to June 27 if he has anything available."  He does have something available that day.  I will get it rescheduled."  I called and left Aram Beecham, scheduler at surgery center, a message to reschedule Saliyah's surgery from 07/01/2017 to 07/08/2017.

## 2017-05-31 DIAGNOSIS — M2550 Pain in unspecified joint: Secondary | ICD-10-CM | POA: Diagnosis not present

## 2017-05-31 DIAGNOSIS — M533 Sacrococcygeal disorders, not elsewhere classified: Secondary | ICD-10-CM | POA: Diagnosis not present

## 2017-05-31 DIAGNOSIS — M459 Ankylosing spondylitis of unspecified sites in spine: Secondary | ICD-10-CM | POA: Diagnosis not present

## 2017-06-11 DIAGNOSIS — M79642 Pain in left hand: Secondary | ICD-10-CM | POA: Diagnosis not present

## 2017-06-11 DIAGNOSIS — G5602 Carpal tunnel syndrome, left upper limb: Secondary | ICD-10-CM | POA: Diagnosis not present

## 2017-06-15 ENCOUNTER — Telehealth: Payer: Self-pay | Admitting: Podiatry

## 2017-06-15 DIAGNOSIS — E039 Hypothyroidism, unspecified: Secondary | ICD-10-CM | POA: Diagnosis not present

## 2017-06-15 DIAGNOSIS — Z6825 Body mass index (BMI) 25.0-25.9, adult: Secondary | ICD-10-CM | POA: Diagnosis not present

## 2017-06-15 DIAGNOSIS — Z124 Encounter for screening for malignant neoplasm of cervix: Secondary | ICD-10-CM | POA: Diagnosis not present

## 2017-06-15 DIAGNOSIS — Z008 Encounter for other general examination: Secondary | ICD-10-CM | POA: Diagnosis not present

## 2017-06-15 DIAGNOSIS — Z01419 Encounter for gynecological examination (general) (routine) without abnormal findings: Secondary | ICD-10-CM | POA: Diagnosis not present

## 2017-06-15 NOTE — Telephone Encounter (Signed)
Hey [name], its Laura LawsAmanda Tapia again. You don't need to call me. I finally found the right number on the portal. I forgot we had signed up with that. Please forgive me. I have pt's that do that to me all the time. You do not need to call me back and I hope you have a great day. Thank you. Bye bye.

## 2017-06-15 NOTE — Telephone Encounter (Signed)
Hello [name]. This is Laura LawsAmanda Mast and I'm scheduled for surgery with Dr. Logan BoresEvans on 27 June. I'm just trying to follow up either with you or the surgical center, I wanted to call the surgical center and make sure they got me down and had all of my information but apparently I have the wrong phone number. I never got the pamphlet. If you could call me back with that number and I can talk with them. Number two, I wanted to make sure someone pre-authorized my surgery and I'm just trying to make sure that's been done. If you could give me a call back, I'm at home today [phone]. I'm just touching base because we are on vacation next week and then when I come back its just going to be a couple of weeks before my surgery. I don't want to leave these loose ends and I haven't heard from anyone. I would really appreciate a call back. Thank you.

## 2017-07-05 ENCOUNTER — Telehealth: Payer: Self-pay | Admitting: Podiatry

## 2017-07-05 DIAGNOSIS — M79676 Pain in unspecified toe(s): Secondary | ICD-10-CM

## 2017-07-05 NOTE — Telephone Encounter (Signed)
I wanted to speak to someone about my medication in regards to my surgery I'm scheduled to have this Thursday, 27 June. If they could call me back at [phone] that is for today. If you cannot reach me today you can call me at [phone] on Tuesday. I'm afraid there is an error on my chart and I just want to make sure that is cleared up before my surgery on Thursday. So if you would just give me a call. Thank you.

## 2017-07-05 NOTE — Telephone Encounter (Signed)
I'm returning your call.  How can I help you?  "I just wanted to let the doctor know that I'm allergic to Tylox but I can take Percocet.  Tylox made me itch in the past.  However, I have had other surgeries since then and I have taken Percocet and it has worked for me.  I don't know what he's going to prescribe me after surgery but I was worried about this information about the Tylox.  I don't want to be in any pain.  I just wanted him to know that the Percocet works for me."  I will let Dr. Logan BoresEvans know.

## 2017-07-06 DIAGNOSIS — M47816 Spondylosis without myelopathy or radiculopathy, lumbar region: Secondary | ICD-10-CM | POA: Diagnosis not present

## 2017-07-08 ENCOUNTER — Encounter: Payer: Self-pay | Admitting: Podiatry

## 2017-07-08 DIAGNOSIS — Q742 Other congenital malformations of lower limb(s), including pelvic girdle: Secondary | ICD-10-CM | POA: Diagnosis not present

## 2017-07-08 DIAGNOSIS — M76822 Posterior tibial tendinitis, left leg: Secondary | ICD-10-CM | POA: Diagnosis not present

## 2017-07-08 DIAGNOSIS — M25775 Osteophyte, left foot: Secondary | ICD-10-CM | POA: Diagnosis not present

## 2017-07-08 DIAGNOSIS — M25572 Pain in left ankle and joints of left foot: Secondary | ICD-10-CM | POA: Diagnosis not present

## 2017-07-14 ENCOUNTER — Ambulatory Visit (INDEPENDENT_AMBULATORY_CARE_PROVIDER_SITE_OTHER): Payer: 59 | Admitting: Podiatry

## 2017-07-14 ENCOUNTER — Encounter: Payer: Self-pay | Admitting: Podiatry

## 2017-07-14 ENCOUNTER — Ambulatory Visit (INDEPENDENT_AMBULATORY_CARE_PROVIDER_SITE_OTHER): Payer: 59

## 2017-07-14 VITALS — BP 122/79 | HR 91 | Temp 97.7°F | Resp 16

## 2017-07-14 DIAGNOSIS — Z9889 Other specified postprocedural states: Secondary | ICD-10-CM

## 2017-07-14 DIAGNOSIS — M76822 Posterior tibial tendinitis, left leg: Secondary | ICD-10-CM

## 2017-07-14 MED ORDER — OXYCODONE-ACETAMINOPHEN 5-325 MG PO TABS: 1.0000 | ORAL_TABLET | Freq: Four times a day (QID) | ORAL | 0 refills | Status: DC | PRN

## 2017-07-20 ENCOUNTER — Other Ambulatory Visit: Payer: Self-pay | Admitting: Internal Medicine

## 2017-07-20 DIAGNOSIS — Z1231 Encounter for screening mammogram for malignant neoplasm of breast: Secondary | ICD-10-CM

## 2017-07-21 ENCOUNTER — Ambulatory Visit (INDEPENDENT_AMBULATORY_CARE_PROVIDER_SITE_OTHER): Payer: 59 | Admitting: Podiatry

## 2017-07-21 DIAGNOSIS — M76822 Posterior tibial tendinitis, left leg: Secondary | ICD-10-CM

## 2017-07-21 DIAGNOSIS — Z9889 Other specified postprocedural states: Secondary | ICD-10-CM

## 2017-07-21 NOTE — Progress Notes (Signed)
   Subjective:  Patient presents today status post PT tendon repair with navicular exostectomy left. DOS: 07/08/17. She reports throbbing, shooting pain to the incision site. She has been taking her pain medication and icing the foot with some relief. She denies modifying factors noted. Patient is here for further evaluation and treatment.    History reviewed. No pertinent past medical history.    Objective/Physical Exam Neurovascular status intact.  Skin incisions appear to be well coapted with sutures and staples intact. No sign of infectious process noted. No dehiscence. No active bleeding noted. Moderate edema noted to the surgical extremity.  Radiographic Exam:  Osteotomies sites appear to be stable with routine healing.  Assessment: 1. s/p PT tendon repair with navicular exostectomy left. DOS: 07/08/17   Plan of Care:  1. Patient was evaluated. X-rays reviewed 2. Dressing changed. Continue nonweightbearing in CAM boot. Keep clean, dry and intact for one week.  3. Refill prescription for Percocet 5/325 mg provided to patient.  4. Return to clinic in one week.    Felecia ShellingBrent M. Evans, DPM [facility]  Dr. Felecia ShellingBrent M. Evans, DPM    [address]                                        [place], [place] [address]                Office [phone]  Fax [phone]

## 2017-07-25 NOTE — Progress Notes (Signed)
   Subjective:  Patient presents today status post PT tendon repair with navicular exostectomy left. DOS: 07/08/17. She states her condition is improving. She has been wearing the CAM boot as directed. There are no new complaints at this time. Patient is here for further evaluation and treatment.    No past medical history on file.    Objective/Physical Exam Neurovascular status intact.  Skin incisions appear to be well coapted with sutures and staples intact. No sign of infectious process noted. No dehiscence. No active bleeding noted. Moderate edema noted to the surgical extremity.  Assessment: 1. s/p PT tendon repair with navicular exostectomy left. DOS: 07/08/17   Plan of Care:  1. Patient was evaluated. 2. Staples removed.  3. Continue nonweightbearing in CAM boot.  4. Prescription for physical therapy three times weekly for 4 weeks beginning on July 24 provided to patient.  5. Return to clinic in 2 weeks.    Felecia ShellingBrent M. Evans, DPM [facility]  Dr. Felecia ShellingBrent M. Evans, DPM    [address]                                        [place], [place] [address]                Office [phone]  Fax [phone]

## 2017-08-04 ENCOUNTER — Ambulatory Visit (INDEPENDENT_AMBULATORY_CARE_PROVIDER_SITE_OTHER): Payer: 59

## 2017-08-04 ENCOUNTER — Ambulatory Visit (INDEPENDENT_AMBULATORY_CARE_PROVIDER_SITE_OTHER): Payer: 59 | Admitting: Podiatry

## 2017-08-04 DIAGNOSIS — Z9889 Other specified postprocedural states: Secondary | ICD-10-CM

## 2017-08-04 DIAGNOSIS — K219 Gastro-esophageal reflux disease without esophagitis: Secondary | ICD-10-CM | POA: Insufficient documentation

## 2017-08-04 DIAGNOSIS — R51 Headache: Secondary | ICD-10-CM

## 2017-08-04 DIAGNOSIS — E663 Overweight: Secondary | ICD-10-CM | POA: Insufficient documentation

## 2017-08-04 DIAGNOSIS — B009 Herpesviral infection, unspecified: Secondary | ICD-10-CM | POA: Insufficient documentation

## 2017-08-04 DIAGNOSIS — E039 Hypothyroidism, unspecified: Secondary | ICD-10-CM | POA: Insufficient documentation

## 2017-08-04 DIAGNOSIS — M76822 Posterior tibial tendinitis, left leg: Secondary | ICD-10-CM | POA: Diagnosis not present

## 2017-08-04 DIAGNOSIS — R110 Nausea: Secondary | ICD-10-CM | POA: Insufficient documentation

## 2017-08-04 DIAGNOSIS — K5909 Other constipation: Secondary | ICD-10-CM | POA: Insufficient documentation

## 2017-08-04 DIAGNOSIS — N951 Menopausal and female climacteric states: Secondary | ICD-10-CM | POA: Insufficient documentation

## 2017-08-04 DIAGNOSIS — M47816 Spondylosis without myelopathy or radiculopathy, lumbar region: Secondary | ICD-10-CM | POA: Insufficient documentation

## 2017-08-04 DIAGNOSIS — R519 Headache, unspecified: Secondary | ICD-10-CM | POA: Insufficient documentation

## 2017-08-04 DIAGNOSIS — N950 Postmenopausal bleeding: Secondary | ICD-10-CM | POA: Insufficient documentation

## 2017-08-04 DIAGNOSIS — N3090 Cystitis, unspecified without hematuria: Secondary | ICD-10-CM | POA: Insufficient documentation

## 2017-08-04 DIAGNOSIS — J309 Allergic rhinitis, unspecified: Secondary | ICD-10-CM | POA: Insufficient documentation

## 2017-08-04 DIAGNOSIS — E7800 Pure hypercholesterolemia, unspecified: Secondary | ICD-10-CM | POA: Insufficient documentation

## 2017-08-06 NOTE — Progress Notes (Signed)
   Subjective:  Patient presents today status post PT tendon repair with navicular exostectomy left. DOS: 07/08/17. She reports some continued soreness and associated bruising. She has been nonweightbearing using the CAM boot as directed. She denies any new complaints or issues. Patient is here for further evaluation and treatment.    No past medical history on file.    Objective/Physical Exam Neurovascular status intact.  Skin incisions appear to be well coapted. No sign of infectious process noted. No dehiscence. No active bleeding noted. Moderate edema noted to the surgical extremity.  Radiographic Exam:  Orthopedic hardware and osteotomies sites appear to be stable with routine healing.   Assessment: 1. s/p PT tendon repair with navicular exostectomy left. DOS: 07/08/17   Plan of Care:  1. Patient was evaluated. X-Rays reviewed.  2. Begin weightbearing in CAM boot.  3. Physical therapy scheduled for 08/09/17 at [facility] in [place].  4. Compression anklet dispensed.  5. Return to clinic in 4 weeks.    Felecia ShellingBrent M. Craven Crean, DPM [facility]  Dr. Felecia ShellingBrent M. Kayin Kettering, DPM    [address]                                        [place], [place] [address]                Office [phone]  Fax [phone]

## 2017-08-09 ENCOUNTER — Ambulatory Visit (HOSPITAL_COMMUNITY): Payer: 59 | Attending: Podiatry

## 2017-08-09 ENCOUNTER — Other Ambulatory Visit: Payer: Self-pay

## 2017-08-09 ENCOUNTER — Encounter (HOSPITAL_COMMUNITY): Payer: Self-pay

## 2017-08-09 ENCOUNTER — Ambulatory Visit: Payer: 59

## 2017-08-09 DIAGNOSIS — M25572 Pain in left ankle and joints of left foot: Secondary | ICD-10-CM

## 2017-08-09 DIAGNOSIS — R2689 Other abnormalities of gait and mobility: Secondary | ICD-10-CM | POA: Diagnosis present

## 2017-08-09 DIAGNOSIS — M150 Primary generalized (osteo)arthritis: Secondary | ICD-10-CM | POA: Diagnosis not present

## 2017-08-09 DIAGNOSIS — M6281 Muscle weakness (generalized): Secondary | ICD-10-CM

## 2017-08-09 DIAGNOSIS — M459 Ankylosing spondylitis of unspecified sites in spine: Secondary | ICD-10-CM | POA: Diagnosis not present

## 2017-08-09 DIAGNOSIS — M2550 Pain in unspecified joint: Secondary | ICD-10-CM | POA: Diagnosis not present

## 2017-08-09 DIAGNOSIS — M79672 Pain in left foot: Secondary | ICD-10-CM | POA: Insufficient documentation

## 2017-08-09 DIAGNOSIS — M25672 Stiffness of left ankle, not elsewhere classified: Secondary | ICD-10-CM

## 2017-08-09 DIAGNOSIS — M25552 Pain in left hip: Secondary | ICD-10-CM | POA: Diagnosis not present

## 2017-08-09 NOTE — Therapy (Signed)
[facility] [address] [place], [place], [address] Phone: [phone]   Fax:  [phone]  Physical Therapy Evaluation  Patient Details  Name: Laura Tapia MRN: [id number] Date of Birth: Dec 01, 1960 Referring Provider: Felecia Shelling, MD   Encounter Date: 08/09/2017  PT End of Session - 08/09/17 1206    Visit Number  1    Number of Visits  15    Date for PT Re-Evaluation  09/20/17 mini reassess 08/30/17    Authorization Type  [employer]    Authorization Time Period  08/09/17 to 09/20/17 (3x/week for 2 weeks, 2x/week for 4 weeks)    Authorization - Visit Number  1    Authorization - Number of Visits  60    PT Start Time  1112    PT Stop Time  1203    PT Time Calculation (min)  51 min    Activity Tolerance  Patient tolerated treatment well    Behavior During Therapy  [facility] for tasks assessed/performed       History reviewed. No pertinent past medical history.  History reviewed. No pertinent surgical history.  There were no vitals filed for this visit.   Subjective Assessment - 08/09/17 1127    Subjective  Pt reports that for the last 2 years she had been having L ankle/foot pain of insidious onset. She has had extensive conservative treatment with minimal success. She got a second opinion and had an MRI which showed bone spurs and surgery was opted for. Pt had bone spurs removed and her posterior tibial tendon was reattached; she stated she had an extra bone within the tendon which was removed. Her pain has been fairly well managed with ice and pain meds. She is currently WBAT in CAM boot and she is using bil axillary crutches at the moment. She reports that her L buttock and hip started hurting her around the same time as her ankle and her surgeon, Dr. Logan Bores diagnosed her with hip bursitis. Lying down on her L side or on her back, walking, and sitting all aggravate her L hip pain. This L hip pain wakes her up every night but she can  fall back to sleep if she repositions and uses heat. She's having the most difficulty with walking/mobility due to her L ankle and L hip. She reports that her L foot is slightly swollen and her big toe and top of her foot are a little numb. No sharp/shooting pains down her legs and she does report chronic LBP.     Limitations  Sitting;House hold activities;Walking    How long can you sit comfortably?  45-60 mins    How long can you stand comfortably?  no real issues    How long can you walk comfortably?  3-4hours    Patient Stated Goals  get rid of pain/improve    Currently in Pain?  No/denies         [facility] PT Assessment - 08/09/17 0001      Assessment   Medical Diagnosis  Left hip bursitis, postop Left posterior tendon repair    Referring Provider  Felecia Shelling, MD    Onset Date/Surgical Date  07/08/17 surgery for posterior tib; hip pain     Next MD Visit  09/01/17    Prior Therapy  yes for ankle pain prior to surgery      Restrictions   Weight Bearing Restrictions  Yes    LLE Weight Bearing  Weight  bearing as tolerated    Other Position/Activity Restrictions  in CAM boot      Balance Screen   Has the patient fallen in the past 6 months  Yes    How many times?  2 when ambulating with crutches over the last few weeks    Has the patient had a decrease in activity level because of a fear of falling?   No    Is the patient reluctant to leave their home because of a fear of falling?   No      Prior Function   Level of Independence  Independent    Vocation  Full time employment currently out of work    [employer]  [employer] - [employer]    Leisure  5 grandchildren, reading, gardening      Observation/Other Assessments   Focus on Therapeutic Outcomes (FOTO)   to be completed next visit      Observation/Other Assessments-Edema    Edema  Figure 8      Figure 8 Edema   Figure 8 - Right   49cm    Figure 8 - Left   59cm      ROM / Strength   AROM / PROM  / Strength  AROM;Strength      AROM   AROM Assessment Site  Ankle    Right/Left Ankle  Right;Left    Right Ankle Dorsiflexion  1    Right Ankle Plantar Flexion  63    Right Ankle Inversion  20    Right Ankle Eversion  18    Left Ankle Dorsiflexion  -15    Left Ankle Plantar Flexion  45    Left Ankle Inversion  5    Left Ankle Eversion  18      Strength   Strength Assessment Site  Hip;Knee;Ankle    Right Hip Flexion  4+/5    Right Hip Extension  4/5    Right Hip ABduction  4/5    Left Hip Flexion  4/5    Left Hip Extension  4-/5 hip pain    Left Hip ABduction  4/5 hip pain    Right Knee Flexion  5/5    Right Knee Extension  5/5    Left Knee Flexion  4+/5    Left Knee Extension  5/5    Right Ankle Dorsiflexion  5/5    Right Ankle Inversion  5/5    Right Ankle Eversion  5/5    Left Ankle Dorsiflexion  4+/5    Left Ankle Inversion  4/5    Left Ankle Eversion  4+/5      Flexibility   Soft Tissue Assessment /Muscle Length  yes    Hamstrings  WNL BLE      Palpation   Palpation comment  increased soft tissue restrictions and tendernes to palpation throughout glutes, piriformis muscle bellies and muscle attachment at greater trochanter      Ambulation/Gait   Ambulation Distance (Feet)  326 Feet    Assistive device  Crutches    Gait Pattern  Step-through pattern;Decreased stance time - left;Decreased stride length;Decreased dorsiflexion - left;Trendelenburg;Antalgic      Balance   Balance Assessed  Yes      Static Standing Balance   Static Standing - Balance Support  No upper extremity supported    Static Standing Balance -  Activities   Single Leg Stance - Right Leg;Single Leg Stance - Left Leg    Static Standing -  Comment/# of Minutes  R: 37.7 sec L: 3sec      Standardized Balance Assessment   Standardized Balance Assessment  Five Times Sit to Stand    Five times sit to stand comments   15.6sec, chair, no UE, off-shifted from LLE          Objective  measurements completed on examination: See above findings.        PT Education - 08/09/17 1206    Education Details  exam findings, POC, HEP    Person(s) Educated  Patient    Methods  Explanation;Demonstration    Comprehension  Verbalized understanding;Returned demonstration       PT Short Term Goals - 08/09/17 1217      PT SHORT TERM GOAL #1   Title  Pt will have improved L ankle AROM for DF, PF, and inversion by 5 deg in order to maximize her gait and functional mobility.    Time  3    Period  Weeks    Status  New    Target Date  08/30/17      PT SHORT TERM GOAL #2   Title  Pt will have 1/2 grade improvement throughout MMT in order to maximize gait and pt's functional mobility.    Time  3    Period  Weeks    Status  New      PT SHORT TERM GOAL #3   Title  Pt will be able to perform 5xSTS in 10 sec or < with proper mechanics to demo improved balance and functional strength.     Time  3    Period  Weeks    Status  New      PT SHORT TERM GOAL #4   Title  Pt will have decreased edema of L ankle by 5cm in order to maximize ROM and pain.    Time  3    Period  Weeks    Status  New        PT Long Term Goals - 08/09/17 1226      PT LONG TERM GOAL #1   Title  Pt will have improved L ankle AROM for DF and PF by 10deg or > in order to further maximize her gait and functional mobility.    Time  6    Period  Weeks    Status  New    Target Date  09/20/17      PT LONG TERM GOAL #2   Title  Pt will have 1 grade improvement throughout MMT in order to maximize gait on uneven ground and stair ambulation    Time  6    Period  Weeks    Status  New      PT LONG TERM GOAL #3   Title  Pt will be able to perform L SLS for 15 sec or > to demo improved functional strength, L ankle stability, and overall balance so as to maximize her gait and stair ambulation.    Time  6    Period  Weeks    Status  New      PT LONG TERM GOAL #4   Title  Pt will be able to ambulate 665ft during  with all necessary A/E, no AD, and gait WFL in order to maximize her community ambulation and her ability to perform work duties with greater ease, once cleared to RTW by her Careers adviser.     Time  6    Period  Weeks  Status  New      PT LONG TERM GOAL #5   Title  Pt will report being able to sleep in her bed and awaken 1x/night or better due to L hip pain in order to maximize her overall recovery and demo improved L hip pain.    Time  6    Period  Weeks    Status  New             Plan - 08/09/17 1207    Clinical Impression Statement  Pt is pleasant 56YO F who presents to OPPT s/p L posterior tibialis tendon repair and L hip bursitis. Pt had surgery on 07/08/17 by Dr. Gala Lewandowsky in which he removed bone spurs and reattached her posterior tibialis tendon. Pt currently presents with post-op deficits in edema, ROM, strength, gait, balance, functional strength and functional mobility. Pt also had increased soft tissue restrictions of L hip with palpable knots throughout gluteals and piriformis and at insertion site along greater trochanter. Pt reported recreation of pain during palpation to this musculature. PT feels that pt's L hip pain will likely improve as her L ankle ROM and strength begins to normalize, however, will address her hip pain as needed throughout her POC. Recommending 3x/week for 2 weeks to address pt's L ankle edema and ROM and then 2x/week for 4 weeks for remainder of POC. Pt needs skilled PT intervention to address her impairments in order to decrease pain and maximize return to PLOF.     Clinical Presentation  Stable    Clinical Presentation due to:  edema, ROM, MMT, SLS, gait, 5xSTS, , functional strength, functional mobility    Clinical Decision Making  Low    Rehab Potential  Good    PT Frequency  3x / week 3x/week for 2 weeks, then 2x/week for 4 weeks    PT Duration  6 weeks    PT Treatment/Interventions  ADLs/Self Care Home Management;Aquatic  Therapy;Cryotherapy;Electrical Stimulation;Moist Heat;Traction;Ultrasound;DME Instruction;Gait training;Stair training;Functional mobility training;Therapeutic activities;Therapeutic exercise;Balance training;Neuromuscular re-education;Patient/family education;Manual techniques;Orthotic Fit/Training;Scar mobilization;Passive range of motion;Dry needling;Energy conservation;Taping    PT Next Visit Plan  review goals and HEP; perform FOTO; focus initially on L ankle ROM and edema management; address L hip pain with stretching, strengthening, and manual, and dry needle pt's R hip for pain management and decreasing soft tissue restrictions    PT Home Exercise Plan  eval: seated ABCs    Consulted and Agree with Plan of Care  Patient       Patient will benefit from skilled therapeutic intervention in order to improve the following deficits and impairments:  Abnormal gait, Decreased activity tolerance, Decreased balance, Decreased endurance, Decreased mobility, Decreased range of motion, Decreased scar mobility, Decreased strength, Difficulty walking, Hypomobility, Increased edema, Increased fascial restricitons, Increased muscle spasms, Impaired flexibility, Impaired sensation, Improper body mechanics, Pain  Visit Diagnosis: Pain in left ankle and joints of left foot - Plan: PT plan of care cert/re-cert  Stiffness of left ankle, not elsewhere classified - Plan: PT plan of care cert/re-cert  Pain in left hip - Plan: PT plan of care cert/re-cert  Muscle weakness (generalized) - Plan: PT plan of care cert/re-cert  Other abnormalities of gait and mobility - Plan: PT plan of care cert/re-cert     Problem List Patient Active Problem List   Diagnosis Date Noted  . Postmenopausal bleeding 08/04/2017  . Overweight 08/04/2017  . Nausea 08/04/2017  . Menopausal syndrome 08/04/2017  . Lumbar spondylosis 08/04/2017  . Hypothyroidism 08/04/2017  .  Hypercholesterolemia 08/04/2017  . Herpes simplex  08/04/2017  . Headache 08/04/2017  . Gastroesophageal reflux disease 08/04/2017  . Cystitis 08/04/2017  . Chronic constipation 08/04/2017  . Allergic rhinitis 08/04/2017  . Pain of left hand 06/11/2017  . Carpal tunnel syndrome of left wrist 06/11/2017  . Knee pain 01/13/1991        Jac CanavanBrooke Jisele Price PT, DPT  [facility] [address] [place], [place], [address] Phone: [phone]   Fax:  [phone]  Name: Alphonsa Overallmanda L Dirks MRN: [id number] Date of Birth: 1960/10/19

## 2017-08-11 ENCOUNTER — Ambulatory Visit (HOSPITAL_COMMUNITY): Payer: 59

## 2017-08-11 ENCOUNTER — Encounter (HOSPITAL_COMMUNITY): Payer: Self-pay

## 2017-08-11 DIAGNOSIS — M6281 Muscle weakness (generalized): Secondary | ICD-10-CM

## 2017-08-11 DIAGNOSIS — M25572 Pain in left ankle and joints of left foot: Secondary | ICD-10-CM | POA: Diagnosis not present

## 2017-08-11 DIAGNOSIS — M25672 Stiffness of left ankle, not elsewhere classified: Secondary | ICD-10-CM

## 2017-08-11 DIAGNOSIS — M79672 Pain in left foot: Secondary | ICD-10-CM

## 2017-08-11 DIAGNOSIS — R2689 Other abnormalities of gait and mobility: Secondary | ICD-10-CM

## 2017-08-11 DIAGNOSIS — M25552 Pain in left hip: Secondary | ICD-10-CM

## 2017-08-11 NOTE — Therapy (Signed)
[facility] [address] [place], [place], [address] Phone: [phone]   Fax:  [phone]  Physical Therapy Treatment  Patient Details  Name: Laura Tapia MRN: [id number] Date of Birth: [birth year] Referring Provider: Felecia Shelling, MD   Encounter Date: 08/11/2017  PT End of Session - 08/11/17 1448    Visit Number  2    Number of Visits  15    Date for PT Re-Evaluation  09/20/17 Minireassess 08/30/17    Authorization Type  [employer]    Authorization Time Period  08/09/17 to 09/20/17 (3x/week for 2 weeks, 2x/week for 4 weeks)    Authorization - Visit Number  2    Authorization - Number of Visits  60    PT Start Time  1434    PT Stop Time  1513    PT Time Calculation (min)  39 min    Activity Tolerance  Patient tolerated treatment well    Behavior During Therapy  [facility] for tasks assessed/performed       History reviewed. No pertinent past medical history.  History reviewed. No pertinent surgical history.  There were no vitals filed for this visit.  Subjective Assessment - 08/11/17 1434    Subjective  Pt arrived with 1 crutch and wearing CAM boot. Reports some sore/tenderness though not real pain today.  Reports compliane with HEP daily.      Patient Stated Goals  get rid of pain/improve    Currently in Pain?  No/denies sore and tenderness         [facility] PT Assessment - 08/11/17 0001      Assessment   Medical Diagnosis  Left hip bursitis, postop Left posterior tendon repair    Referring Provider  Felecia Shelling, MD    Onset Date/Surgical Date  07/08/17 surgery for posterior tib; hip pain    Next MD Visit  09/01/17    Prior Therapy  yes for ankle pain prior to surgery      Restrictions   Weight Bearing Restrictions  Yes    LLE Weight Bearing  Weight bearing as tolerated    Other Position/Activity Restrictions  in CAM boot      Observation/Other Assessments   Focus on Therapeutic Outcomes (FOTO)   57% limited                    [facility] Adult PT Treatment/Exercise - 08/11/17 0001      Exercises   Exercises  Ankle      Manual Therapy   Manual Therapy  Edema management    Manual therapy comments  Manual complete separate rest of tx    Edema Management  Retrograde massage for edema control with LE elevated      Ankle Exercises: Seated   ABC's  1 rep    Ankle Circles/Pumps  10 reps    Heel Raises  20 reps    Heel Raises Limitations  2 sets x 10 reps    Toe Raise  20 reps    Toe Raise Limitations  2 sets x 10 reps    BAPS  Sitting;Level 3;10 reps DF/PF; In/Env; CW/CCW    BAPS Limitations  cueing to reduce compensation with knee movements             PT Education - 08/11/17 1449    Education Details  Reviewed goals, assured compliance with HEP and copy of eval given to pt.  FOTO complete with 57% limitation.  Person(s) Educated  Patient    Methods  Demonstration;Explanation;Handout    Comprehension  Verbalized understanding;Returned demonstration       PT Short Term Goals - 08/09/17 1217      PT SHORT TERM GOAL #1   Title  Pt will have improved L ankle AROM for DF, PF, and inversion by 5 deg in order to maximize her gait and functional mobility.    Time  3    Period  Weeks    Status  New    Target Date  08/30/17      PT SHORT TERM GOAL #2   Title  Pt will have 1/2 grade improvement throughout MMT in order to maximize gait and pt's functional mobility.    Time  3    Period  Weeks    Status  New      PT SHORT TERM GOAL #3   Title  Pt will be able to perform 5xSTS in 10 sec or < with proper mechanics to demo improved balance and functional strength.     Time  3    Period  Weeks    Status  New      PT SHORT TERM GOAL #4   Title  Pt will have decreased edema of L ankle by 5cm in order to maximize ROM and pain.    Time  3    Period  Weeks    Status  New        PT Long Term Goals - 08/09/17 1226      PT LONG TERM GOAL #1   Title  Pt will have improved L  ankle AROM for DF and PF by 10deg or > in order to further maximize her gait and functional mobility.    Time  6    Period  Weeks    Status  New    Target Date  09/20/17      PT LONG TERM GOAL #2   Title  Pt will have 1 grade improvement throughout MMT in order to maximize gait on uneven ground and stair ambulation    Time  6    Period  Weeks    Status  New      PT LONG TERM GOAL #3   Title  Pt will be able to perform L SLS for 15 sec or > to demo improved functional strength, L ankle stability, and overall balance so as to maximize her gait and stair ambulation.    Time  6    Period  Weeks    Status  New      PT LONG TERM GOAL #4   Title  Pt will be able to ambulate 63500ft during 3MWT with all necessary A/E, no AD, and gait WFL in order to maximize her community ambulation and her ability to perform work duties with greater ease, once cleared to RTW by her Careers advisersurgeon.     Time  6    Period  Weeks    Status  New      PT LONG TERM GOAL #5   Title  Pt will report being able to sleep in her bed and awaken 1x/night or better due to L hip pain in order to maximize her overall recovery and demo improved L hip pain.    Time  6    Period  Weeks    Status  New            Plan - 08/11/17 1455  Clinical Impression Statement  Reviewed goals, assured compliance with HEP and copy of eval given to pt.  FOTO complete with 57% limitation.  Session focus on ankle mobility.  Pt able to demonstrate and verbalize appropriate mechanics with current HEP.  Added seated heel/toe raises, circles and BAPS board for ankle mobility, pt able to complete all with minimal difficulty just cueing to reduce compensation with knee movements.  EOS with manual techniques to address edema present in foot, also completed scar tissue mobilization to address adhesions present on incision.  No reports of pain through session.      Rehab Potential  Good    PT Frequency  3x / week 3x/week for 2 weeks, 2x/week for 4 weeks)     PT Duration  6 weeks    PT Next Visit Plan   focus initially on L ankle ROM and edema management; address L hip pain with stretching, strengthening, and manual, and dry needle pt's R hip for pain management and decreasing soft tissue restrictions    PT Home Exercise Plan  eval: seated ABCs       Patient will benefit from skilled therapeutic intervention in order to improve the following deficits and impairments:  Abnormal gait, Decreased activity tolerance, Decreased balance, Decreased endurance, Decreased mobility, Decreased range of motion, Decreased scar mobility, Decreased strength, Difficulty walking, Hypomobility, Increased edema, Increased fascial restricitons, Increased muscle spasms, Impaired flexibility, Impaired sensation, Improper body mechanics, Pain  Visit Diagnosis: Pain in left ankle and joints of left foot  Stiffness of left ankle, not elsewhere classified  Pain in left hip  Muscle weakness (generalized)  Other abnormalities of gait and mobility  Pain in left foot     Problem List Patient Active Problem List   Diagnosis Date Noted  . Postmenopausal bleeding 08/04/2017  . Overweight 08/04/2017  . Nausea 08/04/2017  . Menopausal syndrome 08/04/2017  . Lumbar spondylosis 08/04/2017  . Hypothyroidism 08/04/2017  . Hypercholesterolemia 08/04/2017  . Herpes simplex 08/04/2017  . Headache 08/04/2017  . Gastroesophageal reflux disease 08/04/2017  . Cystitis 08/04/2017  . Chronic constipation 08/04/2017  . Allergic rhinitis 08/04/2017  . Pain of left hand 06/11/2017  . Carpal tunnel syndrome of left wrist 06/11/2017  . Knee pain 01/13/1991   Becky Sax, LPTA; CBIS [phone] Juel Burrow 08/11/2017, 6:56 PM  [facility] [address] [place], [place], [address] Phone: [phone]   Fax:  [phone]  Name: BROOK GERACI MRN: [id number] Date of Birth: 21-Sep-1960

## 2017-08-13 ENCOUNTER — Ambulatory Visit (HOSPITAL_COMMUNITY): Payer: 59 | Attending: Podiatry | Admitting: Physical Therapy

## 2017-08-13 ENCOUNTER — Encounter (HOSPITAL_COMMUNITY): Payer: Self-pay | Admitting: Physical Therapy

## 2017-08-13 DIAGNOSIS — R2689 Other abnormalities of gait and mobility: Secondary | ICD-10-CM

## 2017-08-13 DIAGNOSIS — M25572 Pain in left ankle and joints of left foot: Secondary | ICD-10-CM

## 2017-08-13 DIAGNOSIS — M25552 Pain in left hip: Secondary | ICD-10-CM | POA: Diagnosis not present

## 2017-08-13 DIAGNOSIS — M6281 Muscle weakness (generalized): Secondary | ICD-10-CM | POA: Diagnosis present

## 2017-08-13 DIAGNOSIS — M25672 Stiffness of left ankle, not elsewhere classified: Secondary | ICD-10-CM

## 2017-08-13 NOTE — Therapy (Signed)
[facility] [address] [place], [place], [address] Phone: [phone]   Fax:  [phone]  Physical Therapy Treatment  Patient Details  Name: Laura Tapia MRN: [id number] Date of Birth: 1960/10/21 Referring Provider: Felecia Shelling, MD   Encounter Date: 08/13/2017  PT End of Session - 08/13/17 1651    Visit Number  3    Number of Visits  15    Date for PT Re-Evaluation  09/20/17 Minireassess 08/30/17    Authorization Type  [employer]    Authorization Time Period  08/09/17 to 09/20/17 (3x/week for 2 weeks, 2x/week for 4 weeks)    Authorization - Visit Number  3    Authorization - Number of Visits  60    PT Start Time  1600    PT Stop Time  1640    PT Time Calculation (min)  40 min    Activity Tolerance  Patient tolerated treatment well    Behavior During Therapy  [facility] for tasks assessed/performed       History reviewed. No pertinent past medical history.  History reviewed. No pertinent surgical history.  There were no vitals filed for this visit.  Subjective Assessment - 08/13/17 1603    Subjective  Patient arrived with 1 crutch and is wearing the CAM boot. Denied any pain currently, but stated soem soreness. Patient reported doing her exercises at home.     Patient Stated Goals  get rid of pain/improve    Currently in Pain?  No/denies                       [facility] Adult PT Treatment/Exercise - 08/13/17 0001      Manual Therapy   Manual Therapy  Edema management    Manual therapy comments  Manual complete separate rest of tx    Edema Management  Retrograde massage to left lower extremity for edema control with LE elevated      Ankle Exercises: Seated   ABC's  1 rep    Ankle Circles/Pumps  20 reps    Heel Raises  20 reps    Heel Raises Limitations  2 sets x 10 reps    Toe Raise  20 reps    Toe Raise Limitations  2 sets x 10 reps    BAPS  Sitting;Level 3 2x10 Dorsiflexion/PF, Inversion/Eversion, CW/CCW     Heel Slides  15 reps;Other (comment) With towel under foot and demonstration for NWB    Other Seated Ankle Exercises  Ankle eversion with towel x 10 repetitions             PT Education - 08/13/17 1649    Education Details  Educated patient on purpose and technique of exercises throughout session.     Person(s) Educated  Patient    Methods  Explanation;Demonstration    Comprehension  Verbalized understanding;Returned demonstration       PT Short Term Goals - 08/13/17 1652      PT SHORT TERM GOAL #1   Title  Pt will have improved L ankle AROM for DF, PF, and inversion by 5 deg in order to maximize her gait and functional mobility.    Time  3    Period  Weeks    Status  On-going      PT SHORT TERM GOAL #2   Title  Pt will have 1/2 grade improvement throughout MMT in order to maximize gait and pt's functional mobility.  Time  3    Period  Weeks    Status  On-going      PT SHORT TERM GOAL #3   Title  Pt will be able to perform 5xSTS in 10 sec or < with proper mechanics to demo improved balance and functional strength.     Time  3    Period  Weeks    Status  On-going      PT SHORT TERM GOAL #4   Title  Pt will have decreased edema of L ankle by 5cm in order to maximize ROM and pain.    Time  3    Period  Weeks    Status  On-going        PT Long Term Goals - 08/13/17 1652      PT LONG TERM GOAL #1   Title  Pt will have improved L ankle AROM for DF and PF by 10deg or > in order to further maximize her gait and functional mobility.    Time  6    Period  Weeks    Status  On-going      PT LONG TERM GOAL #2   Title  Pt will have 1 grade improvement throughout MMT in order to maximize gait on uneven ground and stair ambulation    Time  6    Period  Weeks    Status  On-going      PT LONG TERM GOAL #3   Title  Pt will be able to perform L SLS for 15 sec or > to demo improved functional strength, L ankle stability, and overall balance so as to maximize her gait  and stair ambulation.    Time  6    Period  Weeks    Status  On-going      PT LONG TERM GOAL #4   Title  Pt will be able to ambulate 645ft during with all necessary A/E, no AD, and gait WFL in order to maximize her community ambulation and her ability to perform work duties with greater ease, once cleared to RTW by her Careers adviser.     Time  6    Period  Weeks    Status  On-going      PT LONG TERM GOAL #5   Title  Pt will report being able to sleep in her bed and awaken 1x/night or better due to L hip pain in order to maximize her overall recovery and demo improved L hip pain.    Time  6    Period  Weeks    Status  On-going            Plan - 08/13/17 1651    Clinical Impression Statement  This session continued with established plan of care. Added heel slides with towel to increase dorsiflexion and added ankle eversion with towel. Ended session with retrograde massage to address the continued noted swelling in patient's left ankle. Patient reported that her physician told her that she could slowly progress to putting weight on her left lower extremity as able and that by hopefully a week she would be walking without the crutches. Patient would benefit from continued skilled physical therapy in order to continue progression towards functional goals.     Rehab Potential  Good    PT Frequency  3x / week 3x/week for 2 weeks, 2x/week for 4 weeks)    PT Duration  6 weeks    PT Treatment/Interventions  ADLs/Self Care Home Management;Aquatic  Therapy;Cryotherapy;Electrical Stimulation;Moist Heat;Traction;Ultrasound;DME Instruction;Gait training;Stair training;Functional mobility training;Therapeutic activities;Therapeutic exercise;Balance training;Neuromuscular re-education;Patient/family education;Manual techniques;Orthotic Fit/Training;Scar mobilization;Passive range of motion;Dry needling;Energy conservation;Taping    PT Next Visit Plan   focus initially on L ankle ROM and edema management;  address L hip pain with stretching, strengthening, and manual, and dry needle pt's R hip for pain management and decreasing soft tissue restrictions    PT Home Exercise Plan  eval: seated ABCs       Patient will benefit from skilled therapeutic intervention in order to improve the following deficits and impairments:  Abnormal gait, Decreased activity tolerance, Decreased balance, Decreased endurance, Decreased mobility, Decreased range of motion, Decreased scar mobility, Decreased strength, Difficulty walking, Hypomobility, Increased edema, Increased fascial restricitons, Increased muscle spasms, Impaired flexibility, Impaired sensation, Improper body mechanics, Pain  Visit Diagnosis: Pain in left ankle and joints of left foot  Stiffness of left ankle, not elsewhere classified  Pain in left hip  Muscle weakness (generalized)  Other abnormalities of gait and mobility     Problem List Patient Active Problem List   Diagnosis Date Noted  . Postmenopausal bleeding 08/04/2017  . Overweight 08/04/2017  . Nausea 08/04/2017  . Menopausal syndrome 08/04/2017  . Lumbar spondylosis 08/04/2017  . Hypothyroidism 08/04/2017  . Hypercholesterolemia 08/04/2017  . Herpes simplex 08/04/2017  . Headache 08/04/2017  . Gastroesophageal reflux disease 08/04/2017  . Cystitis 08/04/2017  . Chronic constipation 08/04/2017  . Allergic rhinitis 08/04/2017  . Pain of left hand 06/11/2017  . Carpal tunnel syndrome of left wrist 06/11/2017  . Knee pain 01/13/1991   Verne CarrowMacy Elania Crowl PT, DPT 4:54 PM, 08/13/17 [phone]  [facility] [address] [place], [place], [address] Phone: [phone]   Fax:  [phone]  Name: Laura Tapia MRN: [id number] Date of Birth: [birth year]

## 2017-08-16 ENCOUNTER — Ambulatory Visit (HOSPITAL_COMMUNITY): Payer: 59 | Admitting: Physical Therapy

## 2017-08-16 ENCOUNTER — Encounter (HOSPITAL_COMMUNITY): Payer: Self-pay | Admitting: Physical Therapy

## 2017-08-16 ENCOUNTER — Telehealth: Payer: Self-pay | Admitting: Podiatry

## 2017-08-16 DIAGNOSIS — M25572 Pain in left ankle and joints of left foot: Secondary | ICD-10-CM

## 2017-08-16 DIAGNOSIS — R2689 Other abnormalities of gait and mobility: Secondary | ICD-10-CM

## 2017-08-16 DIAGNOSIS — M25672 Stiffness of left ankle, not elsewhere classified: Secondary | ICD-10-CM

## 2017-08-16 DIAGNOSIS — M6281 Muscle weakness (generalized): Secondary | ICD-10-CM

## 2017-08-16 DIAGNOSIS — M25552 Pain in left hip: Secondary | ICD-10-CM

## 2017-08-16 NOTE — Therapy (Signed)
[facility] [address] [place], [place], [address] Phone: [phone]   Fax:  [phone]  Physical Therapy Treatment  Patient Details  Name: Laura Tapia MRN: [id number] Date of Birth: 03-17-1960 Referring Provider: Felecia Shelling, MD   Encounter Date: 08/16/2017  PT End of Session - 08/16/17 1619    Visit Number  4    Number of Visits  15    Date for PT Re-Evaluation  09/20/17 Minireassess 08/30/17    Authorization Type  [employer]    Authorization Time Period  08/09/17 to 09/20/17 (3x/week for 2 weeks, 2x/week for 4 weeks)    Authorization - Visit Number  4    Authorization - Number of Visits  60    PT Start Time  1601    PT Stop Time  1640    PT Time Calculation (min)  39 min    Activity Tolerance  Patient tolerated treatment well    Behavior During Therapy  [facility] for tasks assessed/performed       History reviewed. No pertinent past medical history.  History reviewed. No pertinent surgical history.  There were no vitals filed for this visit.  Subjective Assessment - 08/16/17 1610    Subjective  Patient reported doing all of her exercises at home. She reported that she has been trying to walk some without her crutch. Patient denied pain and reported some soreness.     Patient Stated Goals  get rid of pain/improve    Currently in Pain?  No/denies                       [facility] Adult PT Treatment/Exercise - 08/16/17 0001      Ambulation/Gait   Ambulation Distance (Feet)  226 Feet    Assistive device  None    Gait Pattern  Decreased stance time - left    Ambulation Surface  Level    Gait Comments  Verbal cues for improved stride length      Manual Therapy   Manual Therapy  Edema management    Manual therapy comments  Manual complete separate rest of tx    Edema Management  Retrograde massage to left lower extremity for edema control with LE elevated      Ankle Exercises: Seated   ABC's  1 rep    Ankle Circles/Pumps  20 reps    Heel Raises  20 reps    Heel Raises Limitations  2 sets x 10 reps    Toe Raise  20 reps    Toe Raise Limitations  2 sets x 10 reps    BAPS  Sitting;Level 3;Other (comment) 2x10 DF/PF, Inversion/Eversion, CW/CCW    Heel Slides  15 reps;Other (comment) with towel under foot    Other Seated Ankle Exercises  Ankle inversion/eversion with towel x 10 repetitions               PT Short Term Goals - 08/13/17 1652      PT SHORT TERM GOAL #1   Title  Pt will have improved L ankle AROM for DF, PF, and inversion by 5 deg in order to maximize her gait and functional mobility.    Time  3    Period  Weeks    Status  On-going      PT SHORT TERM GOAL #2   Title  Pt will have 1/2 grade improvement throughout MMT in order to maximize gait and pt's functional mobility.  Time  3    Period  Weeks    Status  On-going      PT SHORT TERM GOAL #3   Title  Pt will be able to perform 5xSTS in 10 sec or < with proper mechanics to demo improved balance and functional strength.     Time  3    Period  Weeks    Status  On-going      PT SHORT TERM GOAL #4   Title  Pt will have decreased edema of L ankle by 5cm in order to maximize ROM and pain.    Time  3    Period  Weeks    Status  On-going        PT Long Term Goals - 08/13/17 1652      PT LONG TERM GOAL #1   Title  Pt will have improved L ankle AROM for DF and PF by 10deg or > in order to further maximize her gait and functional mobility.    Time  6    Period  Weeks    Status  On-going      PT LONG TERM GOAL #2   Title  Pt will have 1 grade improvement throughout MMT in order to maximize gait on uneven ground and stair ambulation    Time  6    Period  Weeks    Status  On-going      PT LONG TERM GOAL #3   Title  Pt will be able to perform L SLS for 15 sec or > to demo improved functional strength, L ankle stability, and overall balance so as to maximize her gait and stair ambulation.    Time  6     Period  Weeks    Status  On-going      PT LONG TERM GOAL #4   Title  Pt will be able to ambulate 63400ft during 3MWT with all necessary A/E, no AD, and gait WFL in order to maximize her community ambulation and her ability to perform work duties with greater ease, once cleared to RTW by her Careers advisersurgeon.     Time  6    Period  Weeks    Status  On-going      PT LONG TERM GOAL #5   Title  Pt will report being able to sleep in her bed and awaken 1x/night or better due to L hip pain in order to maximize her overall recovery and demo improved L hip pain.    Time  6    Period  Weeks    Status  On-going            Plan - 08/16/17 1644    Clinical Impression Statement  This session continued with established plan of care. As patient's physician's office confirmed she was able to ambulate in the boot WBAT began session with one lap around the gym to improve confidence and provided verbal cues to improve gait pattern. Continued session with therapeutic exercises to improve ankle strength and mobility and ended with manual therapy to decrease edema. Plan to continue with exercises for improved ankle mobility, strength and manual therapy to decrease edema.     Rehab Potential  Good    PT Frequency  3x / week 3x/week for 2 weeks, 2x/week for 4 weeks)    PT Duration  6 weeks    PT Treatment/Interventions  ADLs/Self Care Home Management;Aquatic Therapy;Cryotherapy;Electrical Stimulation;Moist Heat;Traction;Ultrasound;DME Instruction;Gait training;Stair training;Functional mobility training;Therapeutic activities;Therapeutic exercise;Balance training;Neuromuscular re-education;Patient/family  education;Manual techniques;Orthotic Fit/Training;Scar mobilization;Passive range of motion;Dry needling;Energy conservation;Taping    PT Next Visit Plan   focus initially on L ankle ROM and edema management; Patient is WBAT in the boot and can progress to no crutches as able. address L hip pain with stretching,  strengthening, and manual, and dry needle pt's R hip for pain management and decreasing soft tissue restrictions    PT Home Exercise Plan  eval: seated ABCs       Patient will benefit from skilled therapeutic intervention in order to improve the following deficits and impairments:  Abnormal gait, Decreased activity tolerance, Decreased balance, Decreased endurance, Decreased mobility, Decreased range of motion, Decreased scar mobility, Decreased strength, Difficulty walking, Hypomobility, Increased edema, Increased fascial restricitons, Increased muscle spasms, Impaired flexibility, Impaired sensation, Improper body mechanics, Pain  Visit Diagnosis: Pain in left ankle and joints of left foot  Stiffness of left ankle, not elsewhere classified  Pain in left hip  Muscle weakness (generalized)  Other abnormalities of gait and mobility     Problem List Patient Active Problem List   Diagnosis Date Noted  . Postmenopausal bleeding 08/04/2017  . Overweight 08/04/2017  . Nausea 08/04/2017  . Menopausal syndrome 08/04/2017  . Lumbar spondylosis 08/04/2017  . Hypothyroidism 08/04/2017  . Hypercholesterolemia 08/04/2017  . Herpes simplex 08/04/2017  . Headache 08/04/2017  . Gastroesophageal reflux disease 08/04/2017  . Cystitis 08/04/2017  . Chronic constipation 08/04/2017  . Allergic rhinitis 08/04/2017  . Pain of left hand 06/11/2017  . Carpal tunnel syndrome of left wrist 06/11/2017  . Knee pain 01/13/1991   Verne Carrow PT, DPT 4:51 PM, 08/16/17 [phone]  [facility] [address] [place], [place], [address] Phone: [phone]   Fax:  [phone]  Name: Laura Tapia MRN: [id number] Date of Birth: 1960/09/24

## 2017-08-16 NOTE — Telephone Encounter (Signed)
Dr. Logan BoresEvans at last office visit 08/04/2017 that pt could begin weight bearing in the CAM boot as tolerated. [facility] called to [facility].

## 2017-08-16 NOTE — Telephone Encounter (Signed)
This is [facility] [place]. Ms. Laura Tapia is coming in at 4 pm today and the physical therapist asked me to call to see what the orders are now? Is she able to stand without the boot? She just didn't have anymore direction and she asked me to give you guys a call. If you could call us back please and ask for [facility]. The number is [phone]. Thanks.

## 2017-08-17 ENCOUNTER — Encounter (HOSPITAL_COMMUNITY): Payer: 59

## 2017-08-18 ENCOUNTER — Encounter (HOSPITAL_COMMUNITY): Payer: Self-pay

## 2017-08-18 ENCOUNTER — Ambulatory Visit (HOSPITAL_COMMUNITY): Payer: 59

## 2017-08-18 DIAGNOSIS — M25572 Pain in left ankle and joints of left foot: Secondary | ICD-10-CM | POA: Diagnosis not present

## 2017-08-18 DIAGNOSIS — R2689 Other abnormalities of gait and mobility: Secondary | ICD-10-CM

## 2017-08-18 DIAGNOSIS — M6281 Muscle weakness (generalized): Secondary | ICD-10-CM

## 2017-08-18 DIAGNOSIS — M25552 Pain in left hip: Secondary | ICD-10-CM

## 2017-08-18 DIAGNOSIS — M25672 Stiffness of left ankle, not elsewhere classified: Secondary | ICD-10-CM

## 2017-08-18 NOTE — Patient Instructions (Signed)
Piriformis Stretch, Supine    Lie supine, one ankle crossed onto opposite knee. Holding bottom leg behind knee, gently pull legs toward chest until stretch is felt in buttock of top leg. Hold 30 seconds. For deeper stretch gently push top knee away from body.  Repeat 3 times per session. Do 1-2 sessions per day.  Copyright  [employer]. All rights reserved.   Hamstring Stretch    With other leg bent, foot flat, grasp right leg and slowly try to straighten knee. Hold 30 seconds. Repeat 3 times. Do 1-2 sessions per day.  http://gt2.exer.us/280   Copyright  V[employer]. All rights reserved.

## 2017-08-18 NOTE — Therapy (Signed)
[name] [address] [place], [place], [address] Phone: [phone]   Fax:  [phone]  Physical Therapy Treatment  Patient Details  Name: Laura Tapia MRN: [id number] Date of Birth: [birth year] Referring Provider: Felecia ShellingBrent M. Evans, MD   Encounter Date: 08/18/2017  PT End of Session - 08/18/17 1345    Visit Number  5    Number of Visits  15    Date for PT Re-Evaluation  09/20/17 Minireassess 08/30/17    Authorization Type  [employer]    Authorization Time Period  08/09/17 to 09/20/17 (3x/week for 2 weeks, 2x/week for 4 weeks)    Authorization - Visit Number  5    Authorization - Number of Visits  60    PT Start Time  1301    PT Stop Time  1344    PT Time Calculation (min)  43 min    Activity Tolerance  Patient tolerated treatment well    Behavior During Therapy  [facility] for tasks assessed/performed       History reviewed. No pertinent past medical history.  History reviewed. No pertinent surgical history.  There were no vitals filed for this visit.  Subjective Assessment - 08/18/17 1305    Subjective  Pt stated no real pain, some tenderness on incision and soreness.  Reports she has increased irration to incision, stated boot rubs on it.      Patient Stated Goals  get rid of pain/improve    Currently in Pain?  No/denies         [facility] PT Assessment - 08/18/17 0001      Assessment   Medical Diagnosis  Left hip bursitis, postop Left posterior tendon repair    Referring Provider  Felecia ShellingBrent M. Evans, MD    Onset Date/Surgical Date  07/08/17    Next MD Visit  09/01/17    Prior Therapy  yes for ankle pain prior to surgery      Restrictions   Weight Bearing Restrictions  Yes    LLE Weight Bearing  Weight bearing as tolerated    Other Position/Activity Restrictions  in CAM boot                   [facility] Adult PT Treatment/Exercise - 08/18/17 0001      Manual Therapy   Manual Therapy  Edema management;Myofascial  release    Manual therapy comments  Manual complete separate rest of tx    Edema Management  Retrograde massage to left lower extremity for edema control with LE elevated    Myofascial Release  scar tissue massage to address adhesions      Ankle Exercises: Seated   Ankle Circles/Pumps  20 reps    Marble Pickup  10 small and 2 large 2 reps    Heel Raises  20 reps    Toe Raise  20 reps    BAPS  Sitting;Level 3;Other (comment);10 reps    Other Seated Ankle Exercises  Ankle inversion/eversion with towel x 10 repetitions    Other Seated Ankle Exercises  Rockerboard Inv/EV; DF/PF 20x each      Ankle Exercises: Supine   Other Supine Ankle Exercises  single leg bridge wtih Rt (NWB Lt ankle)    Other Supine Ankle Exercises  Lt SLR      Ankle Exercises: Stretches   Other Stretch  supine hamstring stretch 3x 30"; figure 4 piriformis st 3x 30"      Ankle Exercises: Sidelying   Other  Sidelying Ankle Exercises  Lt LE abduction               PT Short Term Goals - 08/13/17 1652      PT SHORT TERM GOAL #1   Title  Pt will have improved L ankle AROM for DF, PF, and inversion by 5 deg in order to maximize her gait and functional mobility.    Time  3    Period  Weeks    Status  On-going      PT SHORT TERM GOAL #2   Title  Pt will have 1/2 grade improvement throughout MMT in order to maximize gait and pt's functional mobility.    Time  3    Period  Weeks    Status  On-going      PT SHORT TERM GOAL #3   Title  Pt will be able to perform 5xSTS in 10 sec or < with proper mechanics to demo improved balance and functional strength.     Time  3    Period  Weeks    Status  On-going      PT SHORT TERM GOAL #4   Title  Pt will have decreased edema of L ankle by 5cm in order to maximize ROM and pain.    Time  3    Period  Weeks    Status  On-going        PT Long Term Goals - 08/13/17 1652      PT LONG TERM GOAL #1   Title  Pt will have improved L ankle AROM for DF and PF by 10deg  or > in order to further maximize her gait and functional mobility.    Time  6    Period  Weeks    Status  On-going      PT LONG TERM GOAL #2   Title  Pt will have 1 grade improvement throughout MMT in order to maximize gait on uneven ground and stair ambulation    Time  6    Period  Weeks    Status  On-going      PT LONG TERM GOAL #3   Title  Pt will be able to perform L SLS for 15 sec or > to demo improved functional strength, L ankle stability, and overall balance so as to maximize her gait and stair ambulation.    Time  6    Period  Weeks    Status  On-going      PT LONG TERM GOAL #4   Title  Pt will be able to ambulate 661ft during with all necessary A/E, no AD, and gait WFL in order to maximize her community ambulation and her ability to perform work duties with greater ease, once cleared to RTW by her Careers adviser.     Time  6    Period  Weeks    Status  On-going      PT LONG TERM GOAL #5   Title  Pt will report being able to sleep in her bed and awaken 1x/night or better due to L hip pain in order to maximize her overall recovery and demo improved L hip pain.    Time  6    Period  Weeks    Status  On-going            Plan - 08/18/17 1534    Clinical Impression Statement  Session focus with ankle mobility and LE strengthening.  Use of CAM boot  WBAT during all standing activities, no AD and minimal cueing for gait mechanics.  Pt did report some irritation to incision with pressure from boot, examination presented with no opening of skin just redness over incision.  There are 2 spots that are raises, no s/s of infection just irritated spot of skin.  Added hip strengthening exercises and stretches this session, min cueing for form with sidelying abduction.  EOS with manual techniques to address adhesions on scar tissue and retromassage for edema control.  No reports of increased pain through session.  Pt given HEP to add hamstring and piriformis stretches to HEP.      Rehab  Potential  Good    PT Treatment/Interventions  ADLs/Self Care Home Management;Aquatic Therapy;Cryotherapy;Electrical Stimulation;Moist Heat;Traction;Ultrasound;DME Instruction;Gait training;Stair training;Functional mobility training;Therapeutic activities;Therapeutic exercise;Balance training;Neuromuscular re-education;Patient/family education;Manual techniques;Orthotic Fit/Training;Scar mobilization;Passive range of motion;Dry needling;Energy conservation;Taping    PT Next Visit Plan  Add STS next session in CAM boot.  Continues to address ankle mobitliy and edema managements.  Pt is WBAT in CAM boot.  Address Lt hip pain with stretching, strengthening and manual, and dry needle pt's Rt hip for pain management and decrease soft tissue restrictions.      PT Home Exercise Plan  eval: seated ABCs; hamstring and piriformis stretches       Patient will benefit from skilled therapeutic intervention in order to improve the following deficits and impairments:  Abnormal gait, Decreased activity tolerance, Decreased balance, Decreased endurance, Decreased mobility, Decreased range of motion, Decreased scar mobility, Decreased strength, Difficulty walking, Hypomobility, Increased edema, Increased fascial restricitons, Increased muscle spasms, Impaired flexibility, Impaired sensation, Improper body mechanics, Pain  Visit Diagnosis: Pain in left ankle and joints of left foot  Stiffness of left ankle, not elsewhere classified  Pain in left hip  Muscle weakness (generalized)  Other abnormalities of gait and mobility     Problem List Patient Active Problem List   Diagnosis Date Noted  . Postmenopausal bleeding 08/04/2017  . Overweight 08/04/2017  . Nausea 08/04/2017  . Menopausal syndrome 08/04/2017  . Lumbar spondylosis 08/04/2017  . Hypothyroidism 08/04/2017  . Hypercholesterolemia 08/04/2017  . Herpes simplex 08/04/2017  . Headache 08/04/2017  . Gastroesophageal reflux disease 08/04/2017  .  Cystitis 08/04/2017  . Chronic constipation 08/04/2017  . Allergic rhinitis 08/04/2017  . Pain of left hand 06/11/2017  . Carpal tunnel syndrome of left wrist 06/11/2017  . Knee pain 01/13/1991   Becky Sax, LPTA; CBIS [phone]  Juel Burrow 08/18/2017, 3:46 PM  [place] [address] [place], [place], [address] Phone: [phone]   Fax:  [phone]  Name: Laura Tapia MRN: [id number] Date of Birth: 1960/02/17

## 2017-08-23 ENCOUNTER — Ambulatory Visit (HOSPITAL_COMMUNITY): Payer: 59

## 2017-08-23 ENCOUNTER — Encounter (HOSPITAL_COMMUNITY): Payer: Self-pay

## 2017-08-23 ENCOUNTER — Ambulatory Visit (INDEPENDENT_AMBULATORY_CARE_PROVIDER_SITE_OTHER): Payer: 59 | Admitting: Podiatry

## 2017-08-23 ENCOUNTER — Other Ambulatory Visit: Payer: Self-pay

## 2017-08-23 DIAGNOSIS — M25552 Pain in left hip: Secondary | ICD-10-CM

## 2017-08-23 DIAGNOSIS — M6281 Muscle weakness (generalized): Secondary | ICD-10-CM

## 2017-08-23 DIAGNOSIS — R2689 Other abnormalities of gait and mobility: Secondary | ICD-10-CM

## 2017-08-23 DIAGNOSIS — M25672 Stiffness of left ankle, not elsewhere classified: Secondary | ICD-10-CM

## 2017-08-23 DIAGNOSIS — M25572 Pain in left ankle and joints of left foot: Secondary | ICD-10-CM | POA: Diagnosis not present

## 2017-08-23 DIAGNOSIS — M76822 Posterior tibial tendinitis, left leg: Secondary | ICD-10-CM

## 2017-08-23 DIAGNOSIS — Z9889 Other specified postprocedural states: Secondary | ICD-10-CM

## 2017-08-23 NOTE — Progress Notes (Signed)
   Subjective:  Patient presents today status post PT tendon repair with navicular exostectomy left. DOS: 07/08/17.  The patient has been slightly weightbearing in the immobilization cam boot.  She states that just last week she finally quit using the crutches.  She is concerned for some erythematous areas to the surgical incision site.  She is also been doing physical therapy which was initiated on 08/09/2017.  Patient also reports some residual soreness with swelling to the surgical area she says that physical therapy is going very well.  No past medical history on file.    Objective/Physical Exam Neurovascular status intact.  Skin incisions appear to be well coapted.  There are some very focal areas of absorbable suture noted just under the skin incision site.  There is also focal area along the incision site of hyperkeratotic tissue/callus tissue.  Very minimal erythema noted localized around this area.  No sign of infectious process noted. No dehiscence. No active bleeding noted. Moderate edema noted to the surgical extremity.  Assessment: 1. s/p PT tendon repair with navicular exostectomy left. DOS: 07/08/17   Plan of Care:  1. Patient was evaluated.  Light debridement of the observable suture was performed and the suture was removed.  Antibiotic cream and a Band-Aid was applied.  Hyperkeratotic tissue was also excisionally debrided. 2.  Continue weightbearing in CAM boot.  3.  [facility] [place].  4.  Continue compression anklet   5.  Prescription for doxycycline 100 mg x 1 week more prophylactically 6.  Today we are going to provide extension of short-term disability for an additional 4 weeks after her scheduled return to work date. 7.  Return to clinic in 3 weeks   Felecia ShellingBrent M. Evans, DPM [facility]  Dr. Felecia ShellingBrent M. Evans, DPM    [address]                                        [place], [place] [address]                Office [phone]  [phone]  Fax [phone]

## 2017-08-23 NOTE — Patient Instructions (Addendum)
   TOWEL SLIDES - INVERSION: 2x per day, 2-3 sets, 5-10 repetitions full length of towel  While seated, use a towel and slide it with your foot across the floor in an inward direction.    Be sure to keep your heel in contact with the floor the entire time.  Use a can of soup for a light weight at the end of the towel.      TOWEL SLIDES - EVERSION: 2x per day, 2-3 sets, 5-10 repetitions full length of towel  While seated, use a towel and slide it with your foot across the floor in an outward direction.    Be sure to keep your heel in contact with the floor the entire time.  Use a can of soup for a light weight at the end of the towel.     SEATED HEEL SLIDES WITH TOWEL: 3 x 30 second holds  While in a seated position place your foot on top of a small towel. Then, slowly slide your foot closer towards you.    Hold a gentle stretch and then return foot forward to original position.

## 2017-08-23 NOTE — Therapy (Signed)
[facility] [address] [place], [place], [address] Phone: [phone]   Fax:  [phone]  Physical Therapy Treatment  Patient Details  Name: Laura Tapia MRN: [id number] Date of Birth: [birth year] Referring Provider: Felecia Shelling, MD   Encounter Date: 08/23/2017  PT End of Session - 08/23/17 0811    Visit Number  6    Number of Visits  15    Date for PT Re-Evaluation  09/20/17   Minireassess 08/30/17   Authorization Type  [employer]    Authorization Time Period  08/09/17 to 09/20/17 (3x/week for 2 weeks, 2x/week for 4 weeks)    Authorization - Visit Number  6    Authorization - Number of Visits  60    PT Start Time  0815    PT Stop Time  0859    PT Time Calculation (min)  44 min    Activity Tolerance  Patient tolerated treatment well    Behavior During Therapy  [facility] for tasks assessed/performed       History reviewed. No pertinent past medical history.  History reviewed. No pertinent surgical history.  There were no vitals filed for this visit.  Subjective Assessment - 08/23/17 0812    Subjective  Patient reports she is going to her MD today at 2:30 PM to check her incision as there is a spot that is slightly raised and red. She reports her new HEP stretches are going well and denies difficulty with them.    Limitations  Sitting;House hold activities;Walking    How long can you sit comfortably?  45-60 mins    How long can you stand comfortably?  no real issues    How long can you walk comfortably?  3-4hours    Patient Stated Goals  get rid of pain/improve    Currently in Pain?  No/denies       [facility] Adult PT Treatment/Exercise - 08/23/17 0001      Manual Therapy   Manual Therapy  Edema management;Myofascial release    Manual therapy comments  Manual complete separate rest of tx    Edema Management  Retrograde massage to left lower extremity for edema control with LE elevated      Ankle Exercises: Seated   Towel  Crunch  3 reps;Limitations   3x full length of towel   Towel Inversion/Eversion  Weights;4 reps    Towel Inversion/Eversion Weights (lbs)  1    Towel Inversion/Eversion Limitations  4 reps each direction    Marble Pickup  2x 20 marbles into container    BAPS  Sitting;Level 3;Other (comment);15 reps    BAPS Limitations  clockwise and counter clockwise    Heel Slides  Left;Limitations    Heel Slides Limitations  3x 30 seconds holds for DF stretch    Other Seated Ankle Exercises  Rockerboard Inv/EV; DF/PF 20x each      Ankle Exercises: Supine   Other Supine Ankle Exercises  bridge, 15 reps, 2 sets    Other Supine Ankle Exercises  Lt LE, SLR, 15 reps      Ankle Exercises: Sidelying   Other Sidelying Ankle Exercises  Lt LE hip abduction, 15 reps        PT Education - 08/23/17 0812    Education Details  Edcuated on exercises throughout session and on ROM stretch for ankle dorsiflexion. Educated on new HEP for ankle ROM in sitting.    Person(s) Educated  Patient    Methods  Explanation;Handout  Comprehension  Verbalized understanding       PT Short Term Goals - 08/13/17 1652      PT SHORT TERM GOAL #1   Title  Pt will have improved L ankle AROM for DF, PF, and inversion by 5 deg in order to maximize her gait and functional mobility.    Time  3    Period  Weeks    Status  On-going      PT SHORT TERM GOAL #2   Title  Pt will have 1/2 grade improvement throughout MMT in order to maximize gait and pt's functional mobility.    Time  3    Period  Weeks    Status  On-going      PT SHORT TERM GOAL #3   Title  Pt will be able to perform 5xSTS in 10 sec or < with proper mechanics to demo improved balance and functional strength.     Time  3    Period  Weeks    Status  On-going      PT SHORT TERM GOAL #4   Title  Pt will have decreased edema of L ankle by 5cm in order to maximize ROM and pain.    Time  3    Period  Weeks    Status  On-going        PT Long Term Goals -  08/13/17 1652      PT LONG TERM GOAL #1   Title  Pt will have improved L ankle AROM for DF and PF by 10deg or > in order to further maximize her gait and functional mobility.    Time  6    Period  Weeks    Status  On-going      PT LONG TERM GOAL #2   Title  Pt will have 1 grade improvement throughout MMT in order to maximize gait on uneven ground and stair ambulation    Time  6    Period  Weeks    Status  On-going      PT LONG TERM GOAL #3   Title  Pt will be able to perform L SLS for 15 sec or > to demo improved functional strength, L ankle stability, and overall balance so as to maximize her gait and stair ambulation.    Time  6    Period  Weeks    Status  On-going      PT LONG TERM GOAL #4   Title  Pt will be able to ambulate 63600ft during 3MWT with all necessary A/E, no AD, and gait WFL in order to maximize her community ambulation and her ability to perform work duties with greater ease, once cleared to RTW by her Careers advisersurgeon.     Time  6    Period  Weeks    Status  On-going      PT LONG TERM GOAL #5   Title  Pt will report being able to sleep in her bed and awaken 1x/night or better due to L hip pain in order to maximize her overall recovery and demo improved L hip pain.    Time  6    Period  Weeks    Status  On-going        Plan - 08/23/17 [id number]    Clinical Impression Statement  Continued with established POC for Lt ankle ROM and supine LE strengthening as patient remains WBAT in CAM boot. HEP updated today with towel inversion/eversion as patient demonstrated  good form with exercises. Patient is going to see MD today due to soreness along incision; observation reveals 3 spots that appear to have a stitch coming through and a healing blister around the medial cuneiform and navicular. Retrograde massage performed at EOS to address mild edema in Lt foot and scar massage held until patient follows up with MD today. She will continue to benefit from skilled PT interventions to  address impairments and progress functional mobility.    Rehab Potential  Good    PT Treatment/Interventions  ADLs/Self Care Home Management;Aquatic Therapy;Cryotherapy;Electrical Stimulation;Moist Heat;Traction;Ultrasound;DME Instruction;Gait training;Stair training;Functional mobility training;Therapeutic activities;Therapeutic exercise;Balance training;Neuromuscular re-education;Patient/family education;Manual techniques;Orthotic Fit/Training;Scar mobilization;Passive range of motion;Dry needling;Energy conservation;Taping    PT Next Visit Plan  F/u with MD visit regarding scar healing. Add STS next session in CAM boot.  Continues to address ankle mobility and edema managements.  Pt is WBAT in CAM boot.  Address Lt hip pain with stretching, strengthening and manual, and dry needle pt's Rt hip for pain management and decrease soft tissue restrictions.      PT Home Exercise Plan  eval: seated ABCs; hamstring and piriformis stretches 08/23/17 - heel slides, towel inversion/eversion    Consulted and Agree with Plan of Care  Patient       Patient will benefit from skilled therapeutic intervention in order to improve the following deficits and impairments:  Abnormal gait, Decreased activity tolerance, Decreased balance, Decreased endurance, Decreased mobility, Decreased range of motion, Decreased scar mobility, Decreased strength, Difficulty walking, Hypomobility, Increased edema, Increased fascial restricitons, Increased muscle spasms, Impaired flexibility, Impaired sensation, Improper body mechanics, Pain  Visit Diagnosis: Pain in left ankle and joints of left foot  Stiffness of left ankle, not elsewhere classified  Pain in left hip  Muscle weakness (generalized)  Other abnormalities of gait and mobility     Problem List Patient Active Problem List   Diagnosis Date Noted  . Postmenopausal bleeding 08/04/2017  . Overweight 08/04/2017  . Nausea 08/04/2017  . Menopausal syndrome 08/04/2017   . Lumbar spondylosis 08/04/2017  . Hypothyroidism 08/04/2017  . Hypercholesterolemia 08/04/2017  . Herpes simplex 08/04/2017  . Headache 08/04/2017  . Gastroesophageal reflux disease 08/04/2017  . Cystitis 08/04/2017  . Chronic constipation 08/04/2017  . Allergic rhinitis 08/04/2017  . Pain of left hand 06/11/2017  . Carpal tunnel syndrome of left wrist 06/11/2017  . Knee pain 01/13/1991    Valentino Saxonachel Quinn-Brown, PT, DPT Physical Therapist with [facility]  08/23/2017 9:06 AM    [place] [facility] [address] [place], [place], [address] Phone: [phone]   Fax:  [phone]  Name: Laura Tapia MRN: [id number] Date of Birth: November 03, 1960

## 2017-08-25 ENCOUNTER — Encounter: Payer: Self-pay | Admitting: Podiatry

## 2017-08-26 ENCOUNTER — Encounter (HOSPITAL_COMMUNITY): Payer: Self-pay

## 2017-08-26 ENCOUNTER — Ambulatory Visit (HOSPITAL_COMMUNITY): Payer: 59

## 2017-08-26 DIAGNOSIS — M25572 Pain in left ankle and joints of left foot: Secondary | ICD-10-CM | POA: Diagnosis not present

## 2017-08-26 DIAGNOSIS — M25672 Stiffness of left ankle, not elsewhere classified: Secondary | ICD-10-CM

## 2017-08-26 DIAGNOSIS — M6281 Muscle weakness (generalized): Secondary | ICD-10-CM

## 2017-08-26 DIAGNOSIS — R2689 Other abnormalities of gait and mobility: Secondary | ICD-10-CM

## 2017-08-26 DIAGNOSIS — M25552 Pain in left hip: Secondary | ICD-10-CM

## 2017-08-26 NOTE — Therapy (Signed)
[facility] [address] [place], [place], [address] Phone: [phone]   Fax:  [phone]  Physical Therapy Treatment  Patient Details  Name: Laura Tapia MRN: [id number] Date of Birth: 08-04-1960 Referring Provider: Felecia ShellingBrent M. Evans, MD   Encounter Date: 08/26/2017  PT End of Session - 08/26/17 0904    Visit Number  7    Number of Visits  15    Date for PT Re-Evaluation  09/20/17   Minireassess 08/30/17   Authorization Type  [employer]    Authorization Time Period  08/09/17 to 09/20/17 (3x/week for 2 weeks, 2x/week for 4 weeks)    Authorization - Visit Number  7    Authorization - Number of Visits  60    PT Start Time  0900    PT Stop Time  0941    PT Time Calculation (min)  41 min    Activity Tolerance  Patient tolerated treatment well    Behavior During Therapy  [facility] for tasks assessed/performed       History reviewed. No pertinent past medical history.  History reviewed. No pertinent surgical history.  There were no vitals filed for this visit.  Subjective Assessment - 08/26/17 0901    Subjective  Pt states that the MD thinks she had rubbed a callous on her foot from her boot. He removed 2 stitches and some dead skin and told her to keep neosporin and a bandaid on it. She's just having some soreness and tenderness.    Limitations  Sitting;House hold activities;Walking    How long can you sit comfortably?  45-60 mins    How long can you stand comfortably?  no real issues    How long can you walk comfortably?  3-4hours    Patient Stated Goals  get rid of pain/improve    Currently in Pain?  Yes    Pain Score  2     Pain Location  Foot    Pain Orientation  Left;Medial    Pain Descriptors / Indicators  Sore;Tender    Pain Type  Surgical pain         [facility] PT Assessment - 08/26/17 0001      Assessment   Next MD Visit  09/15/17          [facility] - 08/26/17 0001      Manual Therapy    Manual Therapy  Edema management;Joint mobilization    Manual therapy comments  Manual complete separate rest of tx    Edema Management  Retrograde massage to left lower extremity for edema control with LE elevated    Joint Mobilization  great toe extension mobs and great toe distraction for improved mobility and ROM      Ankle Exercises: Seated   Marble Pickup  2x 20 marbles into container    BAPS  Sitting;Level 4;15 reps    BAPS Limitations  DF/PF, inv/ev, CW/CCW    Heel Slides  Left;Limitations    Heel Slides Limitations  3x 30 seconds holds for DF stretch    Other Seated Ankle Exercises  L great toe extension stretch 3x30" holds      Ankle Exercises: Supine   Other Supine Ankle Exercises  bridging 2x20      Ankle Exercises: Sidelying   Other Sidelying Ankle Exercises  Lt LE hip abduction, 2x15 reps    Other Sidelying Ankle Exercises  LLE iso clams wtih belt 10x10" holds  PT Education - 08/26/17 0904    Education Details  continue HEP    Person(s) Educated  Patient    Methods  Explanation;Demonstration    Comprehension  Verbalized understanding;Returned demonstration       PT Short Term Goals - 08/13/17 1652      PT SHORT TERM GOAL #1   Title  Pt will have improved L ankle AROM for DF, PF, and inversion by 5 deg in order to maximize her gait and functional mobility.    Time  3    Period  Weeks    Status  On-going      PT SHORT TERM GOAL #2   Title  Pt will have 1/2 grade improvement throughout MMT in order to maximize gait and pt's functional mobility.    Time  3    Period  Weeks    Status  On-going      PT SHORT TERM GOAL #3   Title  Pt will be able to perform 5xSTS in 10 sec or < with proper mechanics to demo improved balance and functional strength.     Time  3    Period  Weeks    Status  On-going      PT SHORT TERM GOAL #4   Title  Pt will have decreased edema of L ankle by 5cm in order to maximize ROM and pain.    Time  3    Period  Weeks     Status  On-going        PT Long Term Goals - 08/13/17 1652      PT LONG TERM GOAL #1   Title  Pt will have improved L ankle AROM for DF and PF by 10deg or > in order to further maximize her gait and functional mobility.    Time  6    Period  Weeks    Status  On-going      PT LONG TERM GOAL #2   Title  Pt will have 1 grade improvement throughout MMT in order to maximize gait on uneven ground and stair ambulation    Time  6    Period  Weeks    Status  On-going      PT LONG TERM GOAL #3   Title  Pt will be able to perform L SLS for 15 sec or > to demo improved functional strength, L ankle stability, and overall balance so as to maximize her gait and stair ambulation.    Time  6    Period  Weeks    Status  On-going      PT LONG TERM GOAL #4   Title  Pt will be able to ambulate 68800ft during 3MWT with all necessary A/E, no AD, and gait WFL in order to maximize her community ambulation and her ability to perform work duties with greater ease, once cleared to RTW by her Careers advisersurgeon.     Time  6    Period  Weeks    Status  On-going      PT LONG TERM GOAL #5   Title  Pt will report being able to sleep in her bed and awaken 1x/night or better due to L hip pain in order to maximize her overall recovery and demo improved L hip pain.    Time  6    Period  Weeks    Status  On-going            Plan - 08/26/17 [id number]  Clinical Impression Statement  Pt stating that her MD removed 2 stiches and some dead skin from her incision when she went to see him regarding the potential infection; she states he told her he did not think it was infected, but he put her on antibiotics as precaution. Continued with established POC focusing on improving L ankle mobility. Initiated great toe mobility work this date as it was noted to be limited. Added iso clams with belt to assist with L hip pain. Ended with manual for ankle edema and pain control. No pain at EOS. Continue as planned, progressing as able.     Rehab Potential  Good    PT Treatment/Interventions  ADLs/Self Care Home Management;Aquatic Therapy;Cryotherapy;Electrical Stimulation;Moist Heat;Traction;Ultrasound;DME Instruction;Gait training;Stair training;Functional mobility training;Therapeutic activities;Therapeutic exercise;Balance training;Neuromuscular re-education;Patient/family education;Manual techniques;Orthotic Fit/Training;Scar mobilization;Passive range of motion;Dry needling;Energy conservation;Taping    PT Next Visit Plan  mini reassessment; continue iso clams and add to HEP if +response; Add STS next session in CAM boot.  Continues to address ankle mobility and edema managements.  Pt is WBAT in CAM boot.  Address Lt hip pain with stretching, strengthening and manual, and dry needle pt's Rt hip for pain management and decrease soft tissue restrictions.      PT Home Exercise Plan  eval: seated ABCs; hamstring and piriformis stretches 08/23/17 - heel slides, towel inversion/eversion    Consulted and Agree with Plan of Care  Patient       Patient will benefit from skilled therapeutic intervention in order to improve the following deficits and impairments:  Abnormal gait, Decreased activity tolerance, Decreased balance, Decreased endurance, Decreased mobility, Decreased range of motion, Decreased scar mobility, Decreased strength, Difficulty walking, Hypomobility, Increased edema, Increased fascial restricitons, Increased muscle spasms, Impaired flexibility, Impaired sensation, Improper body mechanics, Pain  Visit Diagnosis: Pain in left ankle and joints of left foot  Stiffness of left ankle, not elsewhere classified  Pain in left hip  Muscle weakness (generalized)  Other abnormalities of gait and mobility     Problem List Patient Active Problem List   Diagnosis Date Noted  . Postmenopausal bleeding 08/04/2017  . Overweight 08/04/2017  . Nausea 08/04/2017  . Menopausal syndrome 08/04/2017  . Lumbar spondylosis  08/04/2017  . Hypothyroidism 08/04/2017  . Hypercholesterolemia 08/04/2017  . Herpes simplex 08/04/2017  . Headache 08/04/2017  . Gastroesophageal reflux disease 08/04/2017  . Cystitis 08/04/2017  . Chronic constipation 08/04/2017  . Allergic rhinitis 08/04/2017  . Pain of left hand 06/11/2017  . Carpal tunnel syndrome of left wrist 06/11/2017  . Knee pain 01/13/1991       Jac Canavan PT, DPT  [facility] [address] [place], [place], [address] Phone: [phone]   Fax:  [phone]  Name: Laura Tapia MRN: [id number] Date of Birth: [birth year]

## 2017-08-27 ENCOUNTER — Telehealth: Payer: Self-pay | Admitting: Podiatry

## 2017-08-27 MED ORDER — DOXYCYCLINE HYCLATE 100 MG PO TABS: 100.0000 mg | ORAL_TABLET | Freq: Two times a day (BID) | ORAL | 0 refills | Status: DC

## 2017-08-27 NOTE — Addendum Note (Signed)
Addended by: Marylou MccoyQUINTANA, Leathie Weich L on: 08/27/2017 12:34 PM   Modules accepted: Orders

## 2017-08-27 NOTE — Telephone Encounter (Signed)
I saw Dr. Logan BoresEvans on Monday because I thought my incision site was infected. He was supposed to call in doxycycline for me. That has yet to be sent to the pharmacy which is [facility] [place]. I did have some at home to start with but I'm out now. If you would remind him to send that prescription in. My home number is [phone]. Thank you. Have a good day.

## 2017-08-30 ENCOUNTER — Encounter (HOSPITAL_COMMUNITY): Payer: Self-pay

## 2017-08-30 ENCOUNTER — Ambulatory Visit (HOSPITAL_COMMUNITY): Payer: 59

## 2017-08-30 DIAGNOSIS — R2689 Other abnormalities of gait and mobility: Secondary | ICD-10-CM

## 2017-08-30 DIAGNOSIS — M25572 Pain in left ankle and joints of left foot: Secondary | ICD-10-CM | POA: Diagnosis not present

## 2017-08-30 DIAGNOSIS — M25672 Stiffness of left ankle, not elsewhere classified: Secondary | ICD-10-CM

## 2017-08-30 DIAGNOSIS — M25552 Pain in left hip: Secondary | ICD-10-CM

## 2017-08-30 DIAGNOSIS — M6281 Muscle weakness (generalized): Secondary | ICD-10-CM

## 2017-08-30 NOTE — Therapy (Signed)
[place], [place], [address] Phone: [phone]   Fax:  [phone]   Progress Note Reporting Period 08/09/17 to 08/30/17  See note below for Objective Data and Assessment of Progress/Goals.   Physical Therapy Treatment  Patient Details  Name: Laura Tapia MRN: [id number] Date of Birth: 1960/09/13 Referring Provider: Edrick Kins, MD   Encounter Date: 08/30/2017  PT End of Session - 08/30/17 0903    Visit Number  8    Number of Visits  15    Date for PT Re-Evaluation  09/20/17   Minireassess completed 08/30/17   Authorization Type  [employer]    Authorization Time Period  08/09/17 to 09/20/17 (3x/week for 2 weeks, 2x/week for 4 weeks)    Authorization - Visit Number  8    Authorization - Number of Visits  60    PT Start Time  0900    PT Stop Time  0942    PT Time Calculation (min)  42 min    Activity Tolerance  Patient tolerated treatment well    Behavior During Therapy  [facility] for tasks assessed/performed       History reviewed. No pertinent past medical history.  History reviewed. No pertinent surgical history.  There were no vitals filed for this visit.  Subjective Assessment - 08/30/17 0902    Subjective  Pt denies any changes since her last visit. She denies any pain currently. States she's slowly getting better.    Limitations  Sitting;House hold activities;Walking    How long can you sit comfortably?  45-60 mins    How long can you stand comfortably?  no real issues    How long can you walk comfortably?  3-4hours    Patient Stated Goals  get rid of pain/improve    Currently in Pain?  No/denies         [facility] PT Assessment - 08/30/17 0001      Assessment   Medical Diagnosis  Left hip bursitis, postop Left posterior tendon repair    Referring Provider  Edrick Kins, MD    Onset Date/Surgical Date  07/08/17    Next MD Visit  09/15/17      Restrictions   Weight Bearing Restrictions   Yes    LLE Weight Bearing  Weight bearing as tolerated    Other Position/Activity Restrictions  in CAM boot      Figure 8 Edema   Figure 8 - Left   56.5cm   was 59cm     AROM   Left Ankle Dorsiflexion  -5   was -15   Left Ankle Plantar Flexion  55   was 45   Left Ankle Inversion  18   was 5     Strength   Right Hip Flexion  5/5   was 4+   Right Hip Extension  4/5   was 4   Right Hip ABduction  4/5   was 4   Left Hip Flexion  4+/5   was 4   Left Hip Extension  4/5   was 4-   Left Hip ABduction  4/5   painfree; was 4 painful   Left Knee Flexion  5/5   was 4+   Left Ankle Dorsiflexion  4+/5   was 4+   Left Ankle Inversion  4/5   was 4   Left Ankle Eversion  4+/5   was 4+     Ambulation/Gait   Ambulation Distance (  Feet)  538 Feet   3MWT; was 385f wtih bil axillary crutches   Assistive device  None    Gait Pattern  Step-through pattern;Decreased step length - left;Decreased dorsiflexion - left;Trendelenburg    Ambulation Surface  Level      Balance   Balance Assessed  Yes      Static Standing Balance   Static Standing - Balance Support  No upper extremity supported    Static Standing Balance -  Activities   Single Leg Stance - Right Leg;Single Leg Stance - Left Leg    Static Standing - Comment/# of Minutes  L: 8.5 or <   was 37.7 sec on R, 3 sec on L     Standardized Balance Assessment   Standardized Balance Assessment  Five Times Sit to Stand    Five times sit to stand comments   8.9sec, no UE, improved weight shift over LLE   was 15.6sec, chair, no UE, off-shifted from LLE            [facility] - 08[facility] 0001      Ankle Exercises: Seated   Towel Crunch  2 reps;Weights;Limitations   1#   BAPS  Sitting;Level 4;15 reps    BAPS Limitations  DF/PF, inv/ev, CW/CCW    Other Seated Ankle Exercises  L great toe extension stretch 3x30" holds             PT Short Term Goals - 08/30/17 0903      PT SHORT TERM GOAL #1    Title  Pt will have improved L ankle AROM for DF, PF, and inversion by 5 deg in order to maximize her gait and functional mobility.    Time  3    Period  Weeks    Status  Partially Met      PT SHORT TERM GOAL #2   Title  Pt will have 1/2 grade improvement throughout MMT in order to maximize gait and pt's functional mobility.    Time  3    Period  Weeks    Status  On-going      PT SHORT TERM GOAL #3   Title  Pt will be able to perform 5xSTS in 10 sec or < with proper mechanics to demo improved balance and functional strength.     Time  3    Period  Weeks    Status  Achieved      PT SHORT TERM GOAL #4   Title  Pt will have decreased edema of L ankle by 5cm in order to maximize ROM and pain.    Time  3    Period  Weeks    Status  On-going        PT Long Term Goals - 08/30/17 0904      PT LONG TERM GOAL #1   Title  Pt will have improved L ankle AROM for DF and PF by 10deg or > in order to further maximize her gait and functional mobility.    Time  6    Period  Weeks    Status  Partially Met      PT LONG TERM GOAL #2   Title  Pt will have 1 grade improvement throughout MMT in order to maximize gait on uneven ground and stair ambulation    Time  6    Period  Weeks    Status  On-going      PT LONG TERM GOAL #3   Title  Pt will be able to perform L SLS for 15 sec or > to demo improved functional strength, L ankle stability, and overall balance so as to maximize her gait and stair ambulation.    Time  6    Period  Weeks    Status  On-going      PT LONG TERM GOAL #4   Title  Pt will be able to ambulate 646f during 3MWT with all necessary A/E, no AD, and gait WFL in order to maximize her community ambulation and her ability to perform work duties with greater ease, once cleared to RTW by her sPsychologist, sport and exercise     Time  6    Period  Weeks    Status  On-going      PT LONG TERM GOAL #5   Title  Pt will report being able to sleep in her bed and awaken 1x/night or better due to L hip  pain in order to maximize her overall recovery and demo improved L hip pain.    Baseline  8/19: not quite there yet, back in the bed some but not every night    Time  6    Period  Weeks    Status  On-going            Plan - 08/30/17 04920   Clinical Impression Statement  PT reassessed pt's goals and outcome measures this date. Pt has made great progress thus far as illustrated above. Her L ankle DF and PF AROM have both improved by 10 deg while her inversion AROM remained the same. She was able to perform L SLS for longer and achieved her 5xSTS goal. Overall, pt making great progress and needs continued skilled PT intervention to address remaining deficits in order to further maximize deficits and return to PLOF. Rest of session focused on L ankle mobility and intrinsic foot strengthening. Added isometric clams with belt and great toe extension stretch to HEP to address L hip pain and deficits in great toe ROM. Continue as planned, progressing as able.    Rehab Potential  Good    PT Treatment/Interventions  ADLs/Self Care Home Management;Aquatic Therapy;Cryotherapy;Electrical Stimulation;Moist Heat;Traction;Ultrasound;DME Instruction;Gait training;Stair training;Functional mobility training;Therapeutic activities;Therapeutic exercise;Balance training;Neuromuscular re-education;Patient/family education;Manual techniques;Orthotic Fit/Training;Scar mobilization;Passive range of motion;Dry needling;Energy conservation;Taping    PT Next Visit Plan  mini reassessment; continue iso clams and add to HEP if +response; Add STS next session in CAM boot.  Continues to address ankle mobility and edema managements.  Pt is WBAT in CAM boot.  Address Lt hip pain with stretching, strengthening and manual, and dry needle pt's Rt hip for pain management and decrease soft tissue restrictions.      PT Home Exercise Plan  eval: seated ABCs; hamstring and piriformis stretches 08/23/17 - heel slides, towel  inversion/eversion    Consulted and Agree with Plan of Care  Patient       Patient will benefit from skilled therapeutic intervention in order to improve the following deficits and impairments:  Abnormal gait, Decreased activity tolerance, Decreased balance, Decreased endurance, Decreased mobility, Decreased range of motion, Decreased scar mobility, Decreased strength, Difficulty walking, Hypomobility, Increased edema, Increased fascial restricitons, Increased muscle spasms, Impaired flexibility, Impaired sensation, Improper body mechanics, Pain  Visit Diagnosis: Pain in left ankle and joints of left foot  Stiffness of left ankle, not elsewhere classified  Pain in left hip  Muscle weakness (generalized)  Other abnormalities of gait and mobility     Problem List Patient Active Problem List  Diagnosis Date Noted  . Postmenopausal bleeding 08/04/2017  . Overweight 08/04/2017  . Nausea 08/04/2017  . Menopausal syndrome 08/04/2017  . Lumbar spondylosis 08/04/2017  . Hypothyroidism 08/04/2017  . Hypercholesterolemia 08/04/2017  . Herpes simplex 08/04/2017  . Headache 08/04/2017  . Gastroesophageal reflux disease 08/04/2017  . Cystitis 08/04/2017  . Chronic constipation 08/04/2017  . Allergic rhinitis 08/04/2017  . Pain of left hand 06/11/2017  . Carpal tunnel syndrome of left wrist 06/11/2017  . Knee pain 01/13/1991       Geraldine Solar PT, DPT  [facility] [address] [place], [place], [address] Phone: [phone]   Fax:  [phone]  Name: KAROLYNN INFANTINO MRN: [id number] Date of Birth: 11-May-1960

## 2017-09-01 ENCOUNTER — Encounter (HOSPITAL_COMMUNITY): Payer: Self-pay

## 2017-09-01 ENCOUNTER — Ambulatory Visit (HOSPITAL_COMMUNITY): Payer: 59

## 2017-09-01 ENCOUNTER — Encounter: Payer: 59 | Admitting: Podiatry

## 2017-09-01 DIAGNOSIS — M25572 Pain in left ankle and joints of left foot: Secondary | ICD-10-CM | POA: Diagnosis not present

## 2017-09-01 DIAGNOSIS — R2689 Other abnormalities of gait and mobility: Secondary | ICD-10-CM

## 2017-09-01 DIAGNOSIS — M25672 Stiffness of left ankle, not elsewhere classified: Secondary | ICD-10-CM

## 2017-09-01 DIAGNOSIS — M25552 Pain in left hip: Secondary | ICD-10-CM

## 2017-09-01 DIAGNOSIS — M6281 Muscle weakness (generalized): Secondary | ICD-10-CM

## 2017-09-01 NOTE — Therapy (Signed)
[place], [place], [address] Phone: [phone]   Fax:  [phone]  Physical Therapy Treatment  Patient Details  Name: Laura Tapia MRN: [id number] Date of Birth: 1960-07-11 Referring Provider: Edrick Kins, MD   Encounter Date: 09/01/2017  PT End of Session - 09/01/17 1034    Visit Number  9    Number of Visits  15    Date for PT Re-Evaluation  09/20/17   Minireassess completed 08/30/17   Authorization Type  [employer]    Authorization Time Period  08/09/17 to 09/20/17 (3x/week for 2 weeks, 2x/week for 4 weeks)    Authorization - Visit Number  9    Authorization - Number of Visits  60    PT Start Time  0349    PT Stop Time  1113    PT Time Calculation (min)  41 min    Activity Tolerance  Patient tolerated treatment well    Behavior During Therapy  [facility] for tasks assessed/performed       History reviewed. No pertinent past medical history.  History reviewed. No pertinent surgical history.  There were no vitals filed for this visit.  Subjective Assessment - 09/01/17 1034    Subjective  Pt reports that she is doing okay today. No pain in her hip and only some tenderness in her foot when she turns a certain way.    Limitations  Sitting;House hold activities;Walking    How long can you sit comfortably?  45-60 mins    How long can you stand comfortably?  no real issues    How long can you walk comfortably?  3-4hours    Patient Stated Goals  get rid of pain/improve    Currently in Pain?  No/denies             [facility] Adult PT Treatment/Exercise - 09/01/17 0001      Exercises   Exercises  Ankle;Knee/Hip      Knee/Hip Exercises: Standing   Hip Abduction  Both;2 sets;10 reps    Abduction Limitations  RTB    Functional Squat  2 sets;10 reps    Functional Squat Limitations  cues for form    Other Standing Knee Exercises  sidestepping RTB 9f x2RT      Knee/Hip Exercises: Seated   Sit to Sand   2 sets;10 reps;without UE support      Knee/Hip Exercises: Supine   Single Leg Bridge  Both;2 sets;10 reps      Knee/Hip Exercises: Sidelying   Clams  2x10 reps, RTB      Manual Therapy   Manual Therapy  Edema management;Joint mobilization;Passive ROM    Manual therapy comments  Manual complete separate rest of tx    Edema Management  Retrograde massage to left lower extremity for edema control with LE elevated    Joint Mobilization  great toe extension mobs and great toe distraction for improved mobility and ROM    Passive ROM  L ankle DF, great toe extension, ankle eversion for improved ROM and stretching      Ankle Exercises: Seated   Towel Crunch  2 reps;Weights;Limitations    Towel Crunch Weights (lbs)  1    Towel Crunch Limitations  increased difficulty with this today    BAPS  Sitting;Level 4;15 reps    BAPS Limitations  board inverted to increase DF ROM -- DF/PF, inv/ev, CW/CCW  PT Education - 09/01/17 1034    Education Details  contiue HEP    Person(s) Educated  Patient    Methods  Explanation;Demonstration    Comprehension  Verbalized understanding;Returned demonstration       PT Short Term Goals - 08/30/17 0903      PT SHORT TERM GOAL #1   Title  Pt will have improved L ankle AROM for DF, PF, and inversion by 5 deg in order to maximize her gait and functional mobility.    Time  3    Period  Weeks    Status  Partially Met      PT SHORT TERM GOAL #2   Title  Pt will have 1/2 grade improvement throughout MMT in order to maximize gait and pt's functional mobility.    Time  3    Period  Weeks    Status  On-going      PT SHORT TERM GOAL #3   Title  Pt will be able to perform 5xSTS in 10 sec or < with proper mechanics to demo improved balance and functional strength.     Time  3    Period  Weeks    Status  Achieved      PT SHORT TERM GOAL #4   Title  Pt will have decreased edema of L ankle by 5cm in order to maximize ROM and pain.    Time  3     Period  Weeks    Status  On-going        PT Long Term Goals - 08/30/17 0904      PT LONG TERM GOAL #1   Title  Pt will have improved L ankle AROM for DF and PF by 10deg or > in order to further maximize her gait and functional mobility.    Time  6    Period  Weeks    Status  Partially Met      PT LONG TERM GOAL #2   Title  Pt will have 1 grade improvement throughout MMT in order to maximize gait on uneven ground and stair ambulation    Time  6    Period  Weeks    Status  On-going      PT LONG TERM GOAL #3   Title  Pt will be able to perform L SLS for 15 sec or > to demo improved functional strength, L ankle stability, and overall balance so as to maximize her gait and stair ambulation.    Time  6    Period  Weeks    Status  On-going      PT LONG TERM GOAL #4   Title  Pt will be able to ambulate 657f during 3MWT with all necessary A/E, no AD, and gait WFL in order to maximize her community ambulation and her ability to perform work duties with greater ease, once cleared to RTW by her sPsychologist, sport and exercise     Time  6    Period  Weeks    Status  On-going      PT LONG TERM GOAL #5   Title  Pt will report being able to sleep in her bed and awaken 1x/night or better due to L hip pain in order to maximize her overall recovery and demo improved L hip pain.    Baseline  8/19: not quite there yet, back in the bed some but not every night    Time  6    Period  Weeks  Status  On-going            Plan - 09/01/17 1116    Clinical Impression Statement  Continued with established POC focusing on L ankle mobility, intrinsic strength and also added functional hip strengthening this date in WB (in CAM boot). No pain reported during hip strengthening, just burning in the targeted muscles. Updated HEP to include bridging and sidelying hip abd. Ended with retro massage for edema, great toe mobility work, and passive ankle stretching/ROM for improved L ankle mobility. No pain reported at EOS.  Continue as planned, progressing as able.     Rehab Potential  Good    PT Treatment/Interventions  ADLs/Self Care Home Management;Aquatic Therapy;Cryotherapy;Electrical Stimulation;Moist Heat;Traction;Ultrasound;DME Instruction;Gait training;Stair training;Functional mobility training;Therapeutic activities;Therapeutic exercise;Balance training;Neuromuscular re-education;Patient/family education;Manual techniques;Orthotic Fit/Training;Scar mobilization;Passive range of motion;Dry needling;Energy conservation;Taping    PT Next Visit Plan  WBAT in CAM boot. add L ankle isometrics next visit Continue hip strengthening; Continues to address ankle mobility and edema managements. Address Lt hip pain with stretching, strengthening and manual, and dry needle pt's Rt hip for pain management and decrease soft tissue restrictions.      PT Home Exercise Plan  eval: seated ABCs; hamstring and piriformis stretches 08/23/17 - heel slides, towel inversion/eversion; 8/21: bridging, sidelying hip abd    Consulted and Agree with Plan of Care  Patient       Patient will benefit from skilled therapeutic intervention in order to improve the following deficits and impairments:  Abnormal gait, Decreased activity tolerance, Decreased balance, Decreased endurance, Decreased mobility, Decreased range of motion, Decreased scar mobility, Decreased strength, Difficulty walking, Hypomobility, Increased edema, Increased fascial restricitons, Increased muscle spasms, Impaired flexibility, Impaired sensation, Improper body mechanics, Pain  Visit Diagnosis: Pain in left ankle and joints of left foot  Stiffness of left ankle, not elsewhere classified  Pain in left hip  Muscle weakness (generalized)  Other abnormalities of gait and mobility     Problem List Patient Active Problem List   Diagnosis Date Noted  . Postmenopausal bleeding 08/04/2017  . Overweight 08/04/2017  . Nausea 08/04/2017  . Menopausal syndrome 08/04/2017   . Lumbar spondylosis 08/04/2017  . Hypothyroidism 08/04/2017  . Hypercholesterolemia 08/04/2017  . Herpes simplex 08/04/2017  . Headache 08/04/2017  . Gastroesophageal reflux disease 08/04/2017  . Cystitis 08/04/2017  . Chronic constipation 08/04/2017  . Allergic rhinitis 08/04/2017  . Pain of left hand 06/11/2017  . Carpal tunnel syndrome of left wrist 06/11/2017  . Knee pain 01/13/1991       Geraldine Solar PT, DPT  [facility] [address] [place], [place], [address] Phone: [phone]   Fax:  [phone]  Name: Laura Tapia MRN: [id number] Date of Birth: 07-03-1960

## 2017-09-02 ENCOUNTER — Ambulatory Visit
Admission: RE | Admit: 2017-09-02 | Discharge: 2017-09-02 | Disposition: A | Discharge status: Home / Self Care | Payer: 59 | Source: Ambulatory Visit | Attending: Internal Medicine | Admitting: Internal Medicine

## 2017-09-02 DIAGNOSIS — Z1231 Encounter for screening mammogram for malignant neoplasm of breast: Secondary | ICD-10-CM

## 2017-09-07 ENCOUNTER — Telehealth (HOSPITAL_COMMUNITY): Payer: Self-pay | Admitting: Obstetrics & Gynecology

## 2017-09-07 NOTE — Telephone Encounter (Signed)
09/07/17  pt left a message to cx said she has some type of GI virus

## 2017-09-08 ENCOUNTER — Encounter (HOSPITAL_COMMUNITY): Payer: 59

## 2017-09-10 ENCOUNTER — Ambulatory Visit (HOSPITAL_COMMUNITY): Payer: 59

## 2017-09-10 ENCOUNTER — Encounter (HOSPITAL_COMMUNITY): Payer: Self-pay

## 2017-09-10 DIAGNOSIS — M6281 Muscle weakness (generalized): Secondary | ICD-10-CM

## 2017-09-10 DIAGNOSIS — R2689 Other abnormalities of gait and mobility: Secondary | ICD-10-CM

## 2017-09-10 DIAGNOSIS — M25552 Pain in left hip: Secondary | ICD-10-CM

## 2017-09-10 DIAGNOSIS — M25672 Stiffness of left ankle, not elsewhere classified: Secondary | ICD-10-CM

## 2017-09-10 DIAGNOSIS — M25572 Pain in left ankle and joints of left foot: Secondary | ICD-10-CM | POA: Diagnosis not present

## 2017-09-10 NOTE — Therapy (Signed)
[place], [place], [address] Phone: [phone]   Fax:  [phone]  Physical Therapy Treatment  Patient Details  Name: Laura Tapia MRN: [id number] Date of Birth: [birth year] Referring Provider: Dorathy Daft.   Encounter Date: 09/10/2017  PT End of Session - 09/10/17 1048    Visit Number  10   MInireassess complete visit 8 on 08/30/17   Number of Visits  15    Date for PT Re-Evaluation  09/20/17    Authorization Type  [employer]    Authorization Time Period  08/09/17 to 09/20/17 (3x/week for 2 weeks, 2x/week for 4 weeks)    Authorization - Visit Number  10    Authorization - Number of Visits  60    PT Start Time  2111    PT Stop Time  1123    PT Time Calculation (min)  48 min    Activity Tolerance  Patient tolerated treatment well    Behavior During Therapy  WFL for tasks assessed/performed       History reviewed. No pertinent past medical history.  History reviewed. No pertinent surgical history.  There were no vitals filed for this visit.  Subjective Assessment - 09/10/17 1036    Subjective  Pt stated she has been sick this week, feels weak and drained today.  Reports decreased HEP compliance though has completed some of the exercises.  Ankle sore, feels maybe related to putting on CAM boot fast to make it to the restroom multiple times.      Patient Stated Goals  get rid of pain/improve    Currently in Pain?  Yes    Pain Score  2     Pain Location  Foot    Pain Orientation  Left;Medial    Pain Descriptors / Indicators  Sore;Tender    Pain Type  Surgical pain         [facility] PT Assessment - 09/10/17 0001      Assessment   Medical Diagnosis  Left hip bursitis, postop Left posterior tendon repair    Referring Provider  Dorathy Daft.    Onset Date/Surgical Date  07/08/17    Next MD Visit  09/15/17    Prior Therapy  yes for ankle pain prior to surgery      Restrictions   Weight Bearing Restrictions   Yes    LLE Weight Bearing  Weight bearing as tolerated    Other Position/Activity Restrictions  in CAM boot                   [facility] Adult PT Treatment/Exercise - 09/10/17 0001      Knee/Hip Exercises: Standing   Functional Squat  2 sets;10 reps    Functional Squat Limitations  cues for form    Other Standing Knee Exercises  sidestepping RTB 61f x2RT      Knee/Hip Exercises: Seated   Sit to Sand  2 sets;10 reps;without UE support      Knee/Hip Exercises: Supine   Single Leg Bridge  Both;2 sets;10 reps      Manual Therapy   Manual Therapy  Joint mobilization;Passive ROM    Joint Mobilization  great toe extension mobs and great toe distraction for improved mobility and ROM    Myofascial Release  scar tissue massage to address adhesions      Ankle Exercises: Seated   Towel Crunch  2 reps;Weights;Limitations    Towel Crunch Weights (lbs)  1  BAPS  Sitting;Level 4;15 reps    BAPS Limitations  board inverted to increase DF ROM -- DF/PF, inv/ev, CW/CCW      Ankle Exercises: Supine   Isometrics  10 x5" all directions      Additional Ankle Exercises DO NOT USE   Towel Crunch Limitations  difficult task today               PT Short Term Goals - 08/30/17 0903      PT SHORT TERM GOAL #1   Title  Pt will have improved L ankle AROM for DF, PF, and inversion by 5 deg in order to maximize her gait and functional mobility.    Time  3    Period  Weeks    Status  Partially Met      PT SHORT TERM GOAL #2   Title  Pt will have 1/2 grade improvement throughout MMT in order to maximize gait and pt's functional mobility.    Time  3    Period  Weeks    Status  On-going      PT SHORT TERM GOAL #3   Title  Pt will be able to perform 5xSTS in 10 sec or < with proper mechanics to demo improved balance and functional strength.     Time  3    Period  Weeks    Status  Achieved      PT SHORT TERM GOAL #4   Title  Pt will have decreased edema of L ankle by 5cm in order  to maximize ROM and pain.    Time  3    Period  Weeks    Status  On-going        PT Long Term Goals - 08/30/17 0904      PT LONG TERM GOAL #1   Title  Pt will have improved L ankle AROM for DF and PF by 10deg or > in order to further maximize her gait and functional mobility.    Time  6    Period  Weeks    Status  Partially Met      PT LONG TERM GOAL #2   Title  Pt will have 1 grade improvement throughout MMT in order to maximize gait on uneven ground and stair ambulation    Time  6    Period  Weeks    Status  On-going      PT LONG TERM GOAL #3   Title  Pt will be able to perform L SLS for 15 sec or > to demo improved functional strength, L ankle stability, and overall balance so as to maximize her gait and stair ambulation.    Time  6    Period  Weeks    Status  On-going      PT LONG TERM GOAL #4   Title  Pt will be able to ambulate 635f during 3MWT with all necessary A/E, no AD, and gait WFL in order to maximize her community ambulation and her ability to perform work duties with greater ease, once cleared to RTW by her sPsychologist, sport and exercise     Time  6    Period  Weeks    Status  On-going      PT LONG TERM GOAL #5   Title  Pt will report being able to sleep in her bed and awaken 1x/night or better due to L hip pain in order to maximize her overall recovery and demo improved L hip pain.  Baseline  8/19: not quite there yet, back in the bed some but not every night    Time  6    Period  Weeks    Status  On-going            Plan - 09/10/17 1157    Clinical Impression Statement  Continued with established POC focusing on ankle mobility, intrinsic and functional hip strengthening.  Added isometric exercises for ankle strengthening.  No reports of increased pain through session, did report some discomfort with inversion motions today.  No edema present ankle today, did complete passive ROM and joint mobs to improve great toe extension and ankle dorsiflexion.      Rehab Potential   Good    PT Frequency  3x / week   3x/week for 2 weeks, 2x/week for 4 weeks)    PT Duration  6 weeks    PT Treatment/Interventions  ADLs/Self Care Home Management;Aquatic Therapy;Cryotherapy;Electrical Stimulation;Moist Heat;Traction;Ultrasound;DME Instruction;Gait training;Stair training;Functional mobility training;Therapeutic activities;Therapeutic exercise;Balance training;Neuromuscular re-education;Patient/family education;Manual techniques;Orthotic Fit/Training;Scar mobilization;Passive range of motion;Dry needling;Energy conservation;Taping    PT Next Visit Plan  WBAT in CAM boot. progress to 4way ankle theraband strengthening next session. Continue hip strengthening; Continues to address ankle mobility and edema managements. Address Lt hip pain with stretching, strengthening and manual, and dry needle pt's Rt hip for pain management and decrease soft tissue restrictions.      PT Home Exercise Plan  eval: seated ABCs; hamstring and piriformis stretches 08/23/17 - heel slides, towel inversion/eversion; 8/21: bridging, sidelying hip abd       Patient will benefit from skilled therapeutic intervention in order to improve the following deficits and impairments:  Abnormal gait, Decreased activity tolerance, Decreased balance, Decreased endurance, Decreased mobility, Decreased range of motion, Decreased scar mobility, Decreased strength, Difficulty walking, Hypomobility, Increased edema, Increased fascial restricitons, Increased muscle spasms, Impaired flexibility, Impaired sensation, Improper body mechanics, Pain  Visit Diagnosis: Pain in left ankle and joints of left foot  Stiffness of left ankle, not elsewhere classified  Pain in left hip  Muscle weakness (generalized)  Other abnormalities of gait and mobility     Problem List Patient Active Problem List   Diagnosis Date Noted  . Postmenopausal bleeding 08/04/2017  . Overweight 08/04/2017  . Nausea 08/04/2017  . Menopausal syndrome  08/04/2017  . Lumbar spondylosis 08/04/2017  . Hypothyroidism 08/04/2017  . Hypercholesterolemia 08/04/2017  . Herpes simplex 08/04/2017  . Headache 08/04/2017  . Gastroesophageal reflux disease 08/04/2017  . Cystitis 08/04/2017  . Chronic constipation 08/04/2017  . Allergic rhinitis 08/04/2017  . Pain of left hand 06/11/2017  . Carpal tunnel syndrome of left wrist 06/11/2017  . Knee pain 01/13/1991   Ihor Austin, [place]; [place]  Aldona Lento 09/10/2017, 12:05 PM  [facility] [address] [place], [place], [address] Phone: [phone]   Fax:  [phone]  Name: ANNABEL GIBEAU MRN: [id number] Date of Birth: 16-Oct-1960

## 2017-09-15 ENCOUNTER — Ambulatory Visit (HOSPITAL_COMMUNITY): Payer: 59 | Attending: Podiatry

## 2017-09-15 ENCOUNTER — Ambulatory Visit (INDEPENDENT_AMBULATORY_CARE_PROVIDER_SITE_OTHER): Payer: 59 | Admitting: Podiatry

## 2017-09-15 ENCOUNTER — Ambulatory Visit: Payer: Self-pay

## 2017-09-15 ENCOUNTER — Ambulatory Visit (INDEPENDENT_AMBULATORY_CARE_PROVIDER_SITE_OTHER): Payer: 59

## 2017-09-15 ENCOUNTER — Other Ambulatory Visit: Payer: Self-pay

## 2017-09-15 ENCOUNTER — Encounter (HOSPITAL_COMMUNITY): Payer: Self-pay

## 2017-09-15 DIAGNOSIS — M6281 Muscle weakness (generalized): Secondary | ICD-10-CM | POA: Insufficient documentation

## 2017-09-15 DIAGNOSIS — M25552 Pain in left hip: Secondary | ICD-10-CM | POA: Insufficient documentation

## 2017-09-15 DIAGNOSIS — M25672 Stiffness of left ankle, not elsewhere classified: Secondary | ICD-10-CM | POA: Diagnosis not present

## 2017-09-15 DIAGNOSIS — R2689 Other abnormalities of gait and mobility: Secondary | ICD-10-CM | POA: Diagnosis present

## 2017-09-15 DIAGNOSIS — M76822 Posterior tibial tendinitis, left leg: Secondary | ICD-10-CM | POA: Diagnosis not present

## 2017-09-15 DIAGNOSIS — M25572 Pain in left ankle and joints of left foot: Secondary | ICD-10-CM

## 2017-09-15 DIAGNOSIS — Z9889 Other specified postprocedural states: Secondary | ICD-10-CM

## 2017-09-15 NOTE — Patient Instructions (Signed)
Access Code: [email or web address]  URL: [email or web address]  Date: 09/15/2017  Prepared by: Jac Canavan   Exercises Long Sitting Ankle Eversion with Resistance - 10 reps - 3 sets - 1x daily - 7x weekly Long Sitting Ankle Inversion with Resistance - 10 reps - 3 sets - 1x daily - 7x weekly Long Sitting Ankle Plantar Flexion with Resistance - 10 reps - 3 sets - 1x daily - 7x weekly Long Sitting Ankle Dorsiflexion with Anchored Resistance - 10 reps - 3 sets - 1x daily - 7x weekly

## 2017-09-15 NOTE — Therapy (Signed)
[place], [place], [address] Phone: [phone]   Fax:  [phone]  Physical Therapy Treatment  Patient Details  Name: Laura Tapia MRN: [id number] Date of Birth: [birth year] Referring Provider: Dorathy Daft.   Encounter Date: 09/15/2017  PT End of Session - 09/15/17 1028    Visit Number  11   MInireassess complete visit 8 on 08/30/17   Number of Visits  15    Date for PT Re-Evaluation  09/20/17    Authorization Type  [employer]    Authorization Time Period  08/09/17 to 09/20/17 (3x/week for 2 weeks, 2x/week for 4 weeks)    Authorization - Visit Number  11    Authorization - Number of Visits  60    PT Start Time  1027    PT Stop Time  1107    PT Time Calculation (min)  40 min    Activity Tolerance  Patient tolerated treatment well    Behavior During Therapy  WFL for tasks assessed/performed       History reviewed. No pertinent past medical history.  History reviewed. No pertinent surgical history.  There were no vitals filed for this visit.  Subjective Assessment - 09/15/17 1028    Subjective  Pt reports to therapy without her CAM boot. Dr. Amalia Hailey is keeping her out of work because her imaging showed some initial developments of osteopenia in her foot, which is likely due to wearing the CAM boot for so long, but he feels that this should reverse once she starts using her foot more now out of the boot. No pain in her L ankle, just hesitant to put full weight.     Patient Stated Goals  get rid of pain/improve    Currently in Pain?  No/denies              [facility] Adult PT Treatment/Exercise - 09/15/17 0001      Ambulation/Gait   Ambulation Distance (Feet)  226 Feet    Assistive device  None    Gait Comments  focusing on heel to toe gait      Exercises   Exercises  Ankle      Ankle Exercises: Stretches   Gastroc Stretch  3 reps;30 seconds    Gastroc Stretch Limitations  slant board    Other  Stretch  L knee drives on 6" step 50N18" holds for ankle DF      Ankle Exercises: Standing   BAPS  Standing;Level 2;15 reps    BAPS Limitations  DF/PF, inv/ev, CW/CCW    SLS  LLE only x10 reps holding for as long as possible (longest ~7")    Rocker Board  2 minutes    Rocker Board Limitations  A/P, R/L x2 mins each    Heel Raises  Both;20 reps    Toe Raise  20 reps      Ankle Exercises: Seated   Heel Raises  Left;20 reps    Heel Raises Limitations  5# for soleus strength    Other Seated Ankle Exercises  seated ankle 4-way wtih RTB x20 reps each (HEP addition)             PT Education - 09/15/17 1028    Education Details  exercise technique, expect some soreness in ankle from muscle fatigue and using them more now that she's out of the boot    Person(s) Educated  Patient    Methods  Explanation;Demonstration;Handout    Comprehension  Verbalized understanding;Returned demonstration       PT Short Term Goals - 08/30/17 0903      PT SHORT TERM GOAL #1   Title  Pt will have improved L ankle AROM for DF, PF, and inversion by 5 deg in order to maximize her gait and functional mobility.    Time  3    Period  Weeks    Status  Partially Met      PT SHORT TERM GOAL #2   Title  Pt will have 1/2 grade improvement throughout MMT in order to maximize gait and pt's functional mobility.    Time  3    Period  Weeks    Status  On-going      PT SHORT TERM GOAL #3   Title  Pt will be able to perform 5xSTS in 10 sec or < with proper mechanics to demo improved balance and functional strength.     Time  3    Period  Weeks    Status  Achieved      PT SHORT TERM GOAL #4   Title  Pt will have decreased edema of L ankle by 5cm in order to maximize ROM and pain.    Time  3    Period  Weeks    Status  On-going        PT Long Term Goals - 08/30/17 0904      PT LONG TERM GOAL #1   Title  Pt will have improved L ankle AROM for DF and PF by 10deg or > in order to further maximize her  gait and functional mobility.    Time  6    Period  Weeks    Status  Partially Met      PT LONG TERM GOAL #2   Title  Pt will have 1 grade improvement throughout MMT in order to maximize gait on uneven ground and stair ambulation    Time  6    Period  Weeks    Status  On-going      PT LONG TERM GOAL #3   Title  Pt will be able to perform L SLS for 15 sec or > to demo improved functional strength, L ankle stability, and overall balance so as to maximize her gait and stair ambulation.    Time  6    Period  Weeks    Status  On-going      PT LONG TERM GOAL #4   Title  Pt will be able to ambulate 652f during 3MWT with all necessary A/E, no AD, and gait WFL in order to maximize her community ambulation and her ability to perform work duties with greater ease, once cleared to RTW by her sPsychologist, sport and exercise     Time  6    Period  Weeks    Status  On-going      PT LONG TERM GOAL #5   Title  Pt will report being able to sleep in her bed and awaken 1x/night or better due to L hip pain in order to maximize her overall recovery and demo improved L hip pain.    Baseline  8/19: not quite there yet, back in the bed some but not every night    Time  6    Period  Weeks    Status  On-going            Plan - 09/15/17 1108    Clinical Impression Statement  Pt presents to therapy  after having f/u with Dr. Amalia Hailey earlier today. She can now WBAT out of her CAM boot. Her imaging showed that she had the initial stages of osteopenia, likely due to her wearing the boot and he feels it will reverse over time being out of the boot. Initiated standing ankle mobility and strengthening this date as well as gait training focusing on heel to toe mechanics. Pt with visible atrophy of L gastric/soleus complex so addressed this with standing and seated heel raises. No pain reported at EOS, just some fatigue; PT educated pt that she should expect some increased muscle soreness now that she is out of the boot and using her ankle  musculature more and increasing her activity out of the boot and she verbalized understanding. Continue as planned, progressing as able.     Rehab Potential  Good    PT Frequency  3x / week   3x/week for 2 weeks, 2x/week for 4 weeks)    PT Duration  6 weeks    PT Treatment/Interventions  ADLs/Self Care Home Management;Aquatic Therapy;Cryotherapy;Electrical Stimulation;Moist Heat;Traction;Ultrasound;DME Instruction;Gait training;Stair training;Functional mobility training;Therapeutic activities;Therapeutic exercise;Balance training;Neuromuscular re-education;Patient/family education;Manual techniques;Orthotic Fit/Training;Scar mobilization;Passive range of motion;Dry needling;Energy conservation;Taping    PT Next Visit Plan  contiue standing ankle mobility, strength, balance;  Continue hip strengthening; Continues to address ankle mobility and edema managements. Address Lt hip pain with stretching, strengthening and manual, and dry needle pt's Rt hip for pain management and decrease soft tissue restrictions.      PT Home Exercise Plan  eval: seated ABCs; hamstring and piriformis stretches 08/23/17 - heel slides, towel inversion/eversion; 8/21: bridging, sidelying hip abd; 9/4: 4-way ankle with RTB    Consulted and Agree with Plan of Care  Patient       Patient will benefit from skilled therapeutic intervention in order to improve the following deficits and impairments:  Abnormal gait, Decreased activity tolerance, Decreased balance, Decreased endurance, Decreased mobility, Decreased range of motion, Decreased scar mobility, Decreased strength, Difficulty walking, Hypomobility, Increased edema, Increased fascial restricitons, Increased muscle spasms, Impaired flexibility, Impaired sensation, Improper body mechanics, Pain  Visit Diagnosis: Pain in left ankle and joints of left foot  Stiffness of left ankle, not elsewhere classified  Pain in left hip  Muscle weakness (generalized)  Other  abnormalities of gait and mobility     Problem List Patient Active Problem List   Diagnosis Date Noted  . Postmenopausal bleeding 08/04/2017  . Overweight 08/04/2017  . Nausea 08/04/2017  . Menopausal syndrome 08/04/2017  . Lumbar spondylosis 08/04/2017  . Hypothyroidism 08/04/2017  . Hypercholesterolemia 08/04/2017  . Herpes simplex 08/04/2017  . Headache 08/04/2017  . Gastroesophageal reflux disease 08/04/2017  . Cystitis 08/04/2017  . Chronic constipation 08/04/2017  . Allergic rhinitis 08/04/2017  . Pain of left hand 06/11/2017  . Carpal tunnel syndrome of left wrist 06/11/2017  . Knee pain 01/13/1991        Geraldine Solar PT, DPT  [facility] [address] [place], [place], [address] Phone: [phone]   Fax:  [phone]  Name: Laura Tapia MRN: [id number] Date of Birth: Jun 11, 1960

## 2017-09-17 ENCOUNTER — Encounter (HOSPITAL_COMMUNITY): Payer: Self-pay

## 2017-09-17 ENCOUNTER — Ambulatory Visit (HOSPITAL_COMMUNITY): Payer: 59

## 2017-09-17 DIAGNOSIS — M25672 Stiffness of left ankle, not elsewhere classified: Secondary | ICD-10-CM

## 2017-09-17 DIAGNOSIS — R2689 Other abnormalities of gait and mobility: Secondary | ICD-10-CM

## 2017-09-17 DIAGNOSIS — M25572 Pain in left ankle and joints of left foot: Secondary | ICD-10-CM

## 2017-09-17 DIAGNOSIS — M6281 Muscle weakness (generalized): Secondary | ICD-10-CM

## 2017-09-17 DIAGNOSIS — M25552 Pain in left hip: Secondary | ICD-10-CM

## 2017-09-17 NOTE — Therapy (Signed)
[place], [place], [address] Phone: [phone]   Fax:  [phone]  Physical Therapy Treatment  Patient Details  Name: Laura Tapia MRN: [id number] Date of Birth: 21-Jan-1960 Referring Provider: Edrick Kins, MD   Encounter Date: 09/17/2017  PT End of Session - 09/17/17 1259    Visit Number  12   MInireassess complete visit 8 on 08/30/17   Number of Visits  15    Date for PT Re-Evaluation  09/20/17    Authorization Type  [place] Time Period  08/09/17 to 09/20/17 (3x/week for 2 weeks, 2x/week for 4 weeks)    Authorization - Visit Number  12    Authorization - Number of Visits  60    PT Start Time  1300    PT Stop Time  1346    PT Time Calculation (min)  46 min    Activity Tolerance  Patient tolerated treatment well    Behavior During Therapy  [facility] for tasks assessed/performed       History reviewed. No pertinent past medical history.  History reviewed. No pertinent surgical history.  There were no vitals filed for this visit.    Subjective Assessment - 09/17/17 1300    Subjective  Pt reports that when she is walking right her foot hurts on the lateral aspect but she's working on it.    Patient Stated Goals  get rid of pain/improve    Currently in Pain?  No/denies         [facility] PT Assessment - 09/17/17 0001      Assessment   Medical Diagnosis  Left hip bursitis, postop Left posterior tendon repair    Referring Provider  Edrick Kins, MD    Onset Date/Surgical Date  07/08/17    Next MD Visit  10/11/17         [facility] Adult PT Treatment/Exercise - 09/17/17 0001      Exercises   Exercises  Ankle      Manual Therapy   Manual Therapy  Joint mobilization    Manual therapy comments  Manual complete separate rest of tx    Joint Mobilization  Grade IV great toe extension mobs and great toe distraction for improved mobility and ROM; Grade II-III AP talocrucal joint mobs for  improved DF       Ankle Exercises: Stretches   Gastroc Stretch  3 reps;30 seconds    Gastroc Stretch Limitations  slant board    Other Stretch  L knee drives on 12" step 01U93" holds for ankle DF      Ankle Exercises: Standing   BAPS  Standing;Level 3;10 reps    BAPS Limitations  DF/PF, inv/ev, CW/CCW    Vector Stance  Left;5 reps;5 seconds    SLS  LLE only x10 reps holding for as long as possible (longest ~7")    Rocker Board  2 minutes    Rocker Board Limitations  A/P, R/L x2 mins each    Heel Raises  Both;20 reps    Toe Raise  20 reps    Warrior I  fwd step ups 4" x20 reps    Other Standing Ankle Exercises  bil tandem stance foam 5x10" each    Other Standing Ankle Exercises  fwd tandem gait x3RT      Ankle Exercises: Seated   Heel Raises  Left;20 reps    Heel Raises Limitations  10# for soleus strength  PT Education - 09/17/17 1300    Education Details  exercise technique, continue HEP    Person(s) Educated  Patient    Methods  Explanation;Demonstration    Comprehension  Verbalized understanding;Returned demonstration       PT Short Term Goals - 08/30/17 0903      PT SHORT TERM GOAL #1   Title  Pt will have improved L ankle AROM for DF, PF, and inversion by 5 deg in order to maximize her gait and functional mobility.    Time  3    Period  Weeks    Status  Partially Met      PT SHORT TERM GOAL #2   Title  Pt will have 1/2 grade improvement throughout MMT in order to maximize gait and pt's functional mobility.    Time  3    Period  Weeks    Status  On-going      PT SHORT TERM GOAL #3   Title  Pt will be able to perform 5xSTS in 10 sec or < with proper mechanics to demo improved balance and functional strength.     Time  3    Period  Weeks    Status  Achieved      PT SHORT TERM GOAL #4   Title  Pt will have decreased edema of L ankle by 5cm in order to maximize ROM and pain.    Time  3    Period  Weeks    Status  On-going        PT Long  Term Goals - 08/30/17 0904      PT LONG TERM GOAL #1   Title  Pt will have improved L ankle AROM for DF and PF by 10deg or > in order to further maximize her gait and functional mobility.    Time  6    Period  Weeks    Status  Partially Met      PT LONG TERM GOAL #2   Title  Pt will have 1 grade improvement throughout MMT in order to maximize gait on uneven ground and stair ambulation    Time  6    Period  Weeks    Status  On-going      PT LONG TERM GOAL #3   Title  Pt will be able to perform L SLS for 15 sec or > to demo improved functional strength, L ankle stability, and overall balance so as to maximize her gait and stair ambulation.    Time  6    Period  Weeks    Status  On-going      PT LONG TERM GOAL #4   Title  Pt will be able to ambulate 656f during 3MWT with all necessary A/E, no AD, and gait WFL in order to maximize her community ambulation and her ability to perform work duties with greater ease, once cleared to RTW by her sPsychologist, sport and exercise     Time  6    Period  Weeks    Status  On-going      PT LONG TERM GOAL #5   Title  Pt will report being able to sleep in her bed and awaken 1x/night or better due to L hip pain in order to maximize her overall recovery and demo improved L hip pain.    Baseline  8/19: not quite there yet, back in the bed some but not every night    Time  6    Period  Weeks    Status  On-going            Plan - 09/17/17 1357    Clinical Impression Statement  Continued with established POC focusing on improving weight acceptance on LLE, strengthening, and balance. Pt tolerating all well, just reporting L ankle fatigue. Added vectors to SLS, tandem gait, and tandem stance on foam for improved ankle strength and stability. Progressed her to 10# seated heel raises for improved soleus strengthening. Introduced pt to AP talocrural joint mobs today for improved DF and continued to address deficits in great toe extension. Pt noted to still have increased  pronation of L foot in WB. She has custom orthotics in her shoe so PT had pt stand in only the orthotics to assess foot positioning in it and she was noted to still be in excessive pronation. Pt verbalizing that she had been thinking about getting new ones and PT encouraged pt to do so so that she could get more arch support and decrease stress/strain on her posterior tibial muscle. Continue as planned, progressing as able.    Rehab Potential  Good    PT Frequency  3x / week   3x/week for 2 weeks, 2x/week for 4 weeks)    PT Duration  6 weeks    PT Treatment/Interventions  ADLs/Self Care Home Management;Aquatic Therapy;Cryotherapy;Electrical Stimulation;Moist Heat;Traction;Ultrasound;DME Instruction;Gait training;Stair training;Functional mobility training;Therapeutic activities;Therapeutic exercise;Balance training;Neuromuscular re-education;Patient/family education;Manual techniques;Orthotic Fit/Training;Scar mobilization;Passive range of motion;Dry needling;Energy conservation;Taping    PT Next Visit Plan  reassessment; contiue standing ankle mobility, strength, balance; progress tandem balance on fowm to include dynamic UE movements; Continue hip strengthening; Continues to address ankle mobility and edema PRN;     PT Home Exercise Plan  eval: seated ABCs; hamstring and piriformis stretches 08/23/17 - heel slides, towel inversion/eversion; 8/21: bridging, sidelying hip abd; 9/4: 4-way ankle with RTB    Consulted and Agree with Plan of Care  Patient       Patient will benefit from skilled therapeutic intervention in order to improve the following deficits and impairments:  Abnormal gait, Decreased activity tolerance, Decreased balance, Decreased endurance, Decreased mobility, Decreased range of motion, Decreased scar mobility, Decreased strength, Difficulty walking, Hypomobility, Increased edema, Increased fascial restricitons, Increased muscle spasms, Impaired flexibility, Impaired sensation, Improper  body mechanics, Pain  Visit Diagnosis: Pain in left ankle and joints of left foot  Stiffness of left ankle, not elsewhere classified  Pain in left hip  Muscle weakness (generalized)  Other abnormalities of gait and mobility     Problem List Patient Active Problem List   Diagnosis Date Noted  . Postmenopausal bleeding 08/04/2017  . Overweight 08/04/2017  . Nausea 08/04/2017  . Menopausal syndrome 08/04/2017  . Lumbar spondylosis 08/04/2017  . Hypothyroidism 08/04/2017  . Hypercholesterolemia 08/04/2017  . Herpes simplex 08/04/2017  . Headache 08/04/2017  . Gastroesophageal reflux disease 08/04/2017  . Cystitis 08/04/2017  . Chronic constipation 08/04/2017  . Allergic rhinitis 08/04/2017  . Pain of left hand 06/11/2017  . Carpal tunnel syndrome of left wrist 06/11/2017  . Knee pain 01/13/1991       Geraldine Solar PT, DPT  [facility] [address] [place], [place], [address] Phone: [phone]   Fax:  [phone]  Name: BRITANNI YARDE MRN: [id number] Date of Birth: 07-May-1960

## 2017-09-17 NOTE — Progress Notes (Signed)
   Subjective:  Patient presents today status post PT tendon repair with navicular exostectomy left. DOS: 07/08/17. She states she is doing well overall. She states she is wearing the CAM boot as directed. She denies any complaints or modifying factors at this time. Patient is here for further evaluation and treatment.   No past medical history on file.    Objective/Physical Exam eurovascular status intact.  Skin incisions appear to be well coapted. No sign of infectious process noted. No dehiscence. No active bleeding noted. Moderate edema noted to the surgical extremity.  Radiographic Exam:  Osteotomies sites appear to be stable with routine healing.   Assessment: 1. s/p PT tendon repair with navicular exostectomy left. DOS: 07/08/17   Plan of Care:  1. Patient was evaluated. X-Rays reviewed.  2. Discontinue using CAM boot. Resume normal shoe gear.  3. Continue physical therapy at [facility].  4. Patient set to return to work on 10/15/17.  5. Return to clinic in 4 weeks.   Felecia Shelling, DPM [facility]  Dr. Felecia Shelling, DPM    [address]                                        [place], [place] [address]                Office [phone]  Fax [phone]

## 2017-09-20 ENCOUNTER — Encounter (HOSPITAL_COMMUNITY): Payer: Self-pay

## 2017-09-20 ENCOUNTER — Ambulatory Visit (HOSPITAL_COMMUNITY): Payer: 59

## 2017-09-20 DIAGNOSIS — M25572 Pain in left ankle and joints of left foot: Secondary | ICD-10-CM | POA: Diagnosis not present

## 2017-09-20 DIAGNOSIS — M6281 Muscle weakness (generalized): Secondary | ICD-10-CM

## 2017-09-20 DIAGNOSIS — M25672 Stiffness of left ankle, not elsewhere classified: Secondary | ICD-10-CM

## 2017-09-20 DIAGNOSIS — R2689 Other abnormalities of gait and mobility: Secondary | ICD-10-CM

## 2017-09-20 DIAGNOSIS — M25552 Pain in left hip: Secondary | ICD-10-CM

## 2017-09-20 NOTE — Therapy (Signed)
[place], [place], [address] Phone: [phone]   Fax:  [phone]  Physical Therapy Treatment  Patient Details  Name: Laura Tapia MRN: [id number] Date of Birth: [birth year] Referring Provider: Edrick Kins, MD   Encounter Date: 09/20/2017  PT End of Session - 09/20/17 1031    Visit Number  13    Number of Visits  23    Date for PT Re-Evaluation  10/18/17    Authorization Type  [place] Time Period  08/09/17 to 09/20/17; NEW: 09/20/17 to 10/18/17    Authorization - Visit Number  13    Authorization - Number of Visits  60    PT Start Time  6962    PT Stop Time  1114    PT Time Calculation (min)  44 min    Activity Tolerance  Patient tolerated treatment well    Behavior During Therapy  [facility] for tasks assessed/performed       History reviewed. No pertinent past medical history.  History reviewed. No pertinent surgical history.  There were no vitals filed for this visit.  Subjective Assessment - 09/20/17 1031    Subjective  Pt states that she is not having any pain this monring, just some soreness on the outside of her foot.     Patient Stated Goals  get rid of pain/improve    Currently in Pain?  No/denies         [facility] PT Assessment - 09/20/17 0001      Assessment   Medical Diagnosis  Left hip bursitis, postop Left posterior tendon repair    Referring Provider  Edrick Kins, MD    Onset Date/Surgical Date  07/08/17    Next MD Visit  10/11/17      Restrictions   LLE Weight Bearing  Weight bearing as tolerated      Figure 8 Edema   Figure 8 - Left   46.5cm   was 56.5cm     AROM   Left Ankle Dorsiflexion  1   was -5   Left Ankle Plantar Flexion  55   was 55   Left Ankle Inversion  18   was 18     Strength   Right Hip Extension  4/5   was 4   Right Hip ABduction  4/5   was 4   Left Hip Flexion  5/5   was 4+   Left Hip Extension  4/5   was 4   Left Hip  ABduction  4/5   wa 4   Left Ankle Dorsiflexion  5/5   was 4+   Left Ankle Inversion  5/5   was 4   Left Ankle Eversion  5/5   was 4+     Ambulation/Gait   Ambulation Distance (Feet)  586 Feet   was 538   Assistive device  None    Gait Pattern  Step-through pattern;Trendelenburg      Balance   Balance Assessed  Yes      Static Standing Balance   Static Standing - Balance Support  No upper extremity supported    Static Standing Balance -  Activities   Single Leg Stance - Left Leg    Static Standing - Comment/# of Minutes  L: 7.6sec or <            [facility] Adult PT Treatment/Exercise - 09/20/17 0001      Exercises   Exercises  Ankle      Manual Therapy   Manual Therapy  Soft tissue mobilization    Manual therapy comments  Manual complete separate rest of tx    Soft tissue mobilization  STM to L extensor digitorum brevis for decreased pain and restrictions      Ankle Exercises: Stretches   Gastroc Stretch  3 reps;30 seconds    Gastroc Stretch Limitations  slant board      Ankle Exercises: Standing   Heel Raises  Both;20 reps    Heel Raises Limitations  tennis ball between to increase peroneal activation    Warrior I  fwd/lat step ups 6" x20 reps each    Warrior II  fwd step downs 6" x20 reps             PT Education - 09/20/17 1125    Education Details  reassessment findings, conitnue HEP    Person(s) Educated  Patient    Methods  Explanation;Demonstration    Comprehension  Verbalized understanding;Returned demonstration       PT Short Term Goals - 09/20/17 1033      PT SHORT TERM GOAL #1   Title  Pt will have improved L ankle AROM for DF, PF, and inversion by 5 deg in order to maximize her gait and functional mobility.    Baseline  9/9: see ROM    Time  3    Period  Weeks    Status  Achieved      PT SHORT TERM GOAL #2   Title  Pt will have 1/2 grade improvement throughout MMT in order to maximize gait and pt's functional mobility.    Baseline   9/9: see MMT    Time  3    Period  Weeks    Status  Partially Met      PT SHORT TERM GOAL #3   Title  Pt will be able to perform 5xSTS in 10 sec or < with proper mechanics to demo improved balance and functional strength.     Time  3    Period  Weeks    Status  Achieved      PT SHORT TERM GOAL #4   Title  Pt will have decreased edema of L ankle by 5cm in order to maximize ROM and pain.    Time  3    Period  Weeks    Status  Achieved        PT Long Term Goals - 09/20/17 1033      PT LONG TERM GOAL #1   Title  Pt will have improved L ankle AROM for DF and PF by 10deg or > in order to further maximize her gait and functional mobility.    Baseline  9/9: see ROM    Time  6    Period  Weeks    Status  Achieved      PT LONG TERM GOAL #2   Title  Pt will have 1 grade improvement throughout MMT in order to maximize gait on uneven ground and stair ambulation    Baseline  9/9: see MMT    Time  6    Period  Weeks    Status  Partially Met      PT LONG TERM GOAL #3   Title  Pt will be able to perform L SLS for 15 sec or > to demo improved functional strength, L ankle stability, and overall balance so as to maximize her gait and stair ambulation.  Baseline  9/9: 7.6sec or <    Time  6    Period  Weeks    Status  On-going      PT LONG TERM GOAL #4   Title  Pt will be able to ambulate 614f during 3MWT with all necessary A/E, no AD, and gait WFL in order to maximize her community ambulation and her ability to perform work duties with greater ease, once cleared to RTW by her sPsychologist, sport and exercise     Baseline  9/9: 5847f L trendelenberg    Time  6    Period  Weeks    Status  On-going      PT LONG TERM GOAL #5   Title  Pt will report being able to sleep in her bed and awaken 1x/night or better due to L hip pain in order to maximize her overall recovery and demo improved L hip pain.    Baseline  9/9: reports this is better, sleeping in bed but still getting up due to other things, not L hip     Time  6    Period  Weeks    Status  Achieved            Plan - 09/20/17 1124    Clinical Impression Statement  PT reassessed pt's goals and outcome measures this date. Pt has made great progress towards goals as illustrated above. Her R ankle AROM has significantly improved as she was +1deg past neutral for DF (was -15deg at her eval) and her overall ankle MMT has improved. Her hip MMT has remained relatively unchanged and her functional ankle strength is still deficient AEB minor gait deviations and difficulty performing SLS on LLE. Pt feels 75-80% improved since starting therapy, reporting that her remaining limiting factors is difficulty with stairs due to instability and fear of rolling her ankle. Pt is still out of work until she f/u with Dr. EvAmalia Haileyn 10/11/17; PT recommends continuation of therapy services to continue to address her limitations in functional ankle strength, dynamic balance, and overall stability in order to further maximize her gait, stairs, and ability to RTW once cleared by MD. EnArlana Pouchession with stair training, functional ankle strengthening, and manual to L extensor digitorum brevis as she is still c/o pain in this area of her foot during WB activities; she was noted to have increased restrictions and palpable knot in this mm belly which recreated her pain. Decreased pain with walking verbalized following. Continue as planned, progressing as able.     Rehab Potential  Good    PT Frequency  3x / week   3x/week for 2 weeks, 2x/week for 4 weeks)    PT Duration  6 weeks    PT Treatment/Interventions  ADLs/Self Care Home Management;Aquatic Therapy;Cryotherapy;Electrical Stimulation;Moist Heat;Traction;Ultrasound;DME Instruction;Gait training;Stair training;Functional mobility training;Therapeutic activities;Therapeutic exercise;Balance training;Neuromuscular re-education;Patient/family education;Manual techniques;Orthotic Fit/Training;Scar mobilization;Passive range of  motion;Dry needling;Energy conservation;Taping    PT Next Visit Plan  contiue standing ankle mobility, strength, balance; progress tandem balance on foam to include dynamic UE movements; Continue hip strengthening; Continues to address ankle mobility and soft tissue restrictions in extensor digitorum brevis    PT Home Exercise Plan  eval: seated ABCs; hamstring and piriformis stretches 08/23/17 - heel slides, towel inversion/eversion; 8/21: bridging, sidelying hip abd; 9/4: 4-way ankle with RTB    Consulted and Agree with Plan of Care  Patient       Patient will benefit from skilled therapeutic intervention in order to improve the following deficits and impairments:  Abnormal gait, Decreased activity tolerance, Decreased balance, Decreased endurance, Decreased mobility, Decreased range of motion, Decreased scar mobility, Decreased strength, Difficulty walking, Hypomobility, Increased edema, Increased fascial restricitons, Increased muscle spasms, Impaired flexibility, Impaired sensation, Improper body mechanics, Pain  Visit Diagnosis: Pain in left ankle and joints of left foot - Plan: PT plan of care cert/re-cert  Stiffness of left ankle, not elsewhere classified - Plan: PT plan of care cert/re-cert  Pain in left hip - Plan: PT plan of care cert/re-cert  Muscle weakness (generalized) - Plan: PT plan of care cert/re-cert  Other abnormalities of gait and mobility - Plan: PT plan of care cert/re-cert     Problem List Patient Active Problem List   Diagnosis Date Noted  . Postmenopausal bleeding 08/04/2017  . Overweight 08/04/2017  . Nausea 08/04/2017  . Menopausal syndrome 08/04/2017  . Lumbar spondylosis 08/04/2017  . Hypothyroidism 08/04/2017  . Hypercholesterolemia 08/04/2017  . Herpes simplex 08/04/2017  . Headache 08/04/2017  . Gastroesophageal reflux disease 08/04/2017  . Cystitis 08/04/2017  . Chronic constipation 08/04/2017  . Allergic rhinitis 08/04/2017  . Pain of left  hand 06/11/2017  . Carpal tunnel syndrome of left wrist 06/11/2017  . Knee pain 01/13/1991       Geraldine Solar PT, DPT   [facility] [address] [place], [place], [address] Phone: [phone]   Fax:  [phone]  Name: ELZA VARRICCHIO MRN: [id number] Date of Birth: [birth year]

## 2017-09-22 ENCOUNTER — Ambulatory Visit (HOSPITAL_COMMUNITY): Payer: 59 | Admitting: Physical Therapy

## 2017-09-22 DIAGNOSIS — R2689 Other abnormalities of gait and mobility: Secondary | ICD-10-CM

## 2017-09-22 DIAGNOSIS — M6281 Muscle weakness (generalized): Secondary | ICD-10-CM

## 2017-09-22 DIAGNOSIS — M25572 Pain in left ankle and joints of left foot: Secondary | ICD-10-CM | POA: Diagnosis not present

## 2017-09-22 DIAGNOSIS — M25552 Pain in left hip: Secondary | ICD-10-CM

## 2017-09-22 DIAGNOSIS — M25672 Stiffness of left ankle, not elsewhere classified: Secondary | ICD-10-CM

## 2017-09-22 NOTE — Therapy (Signed)
[place], [place], [address] Phone: [phone]   Fax:  [phone]  Physical Therapy Treatment  Patient Details  Name: Laura Tapia MRN: [id number] Date of Birth: 01-30-1960 Referring Provider: Edrick Kins, MD   Encounter Date: 09/22/2017  PT End of Session - 09/22/17 1108    Visit Number  14    Number of Visits  23    Date for PT Re-Evaluation  10/18/17    Authorization Type  [place] Time Period  08/09/17 to 09/20/17; NEW: 09/20/17 to 10/18/17    Authorization - Visit Number  14    Authorization - Number of Visits  60    PT Start Time  4163    PT Stop Time  1120    PT Time Calculation (min)  45 min    Activity Tolerance  Patient tolerated treatment well    Behavior During Therapy  [facility] for tasks assessed/performed       No past medical history on file.  No past surgical history on file.  There were no vitals filed for this visit.  Subjective Assessment - 09/22/17 1035    Subjective  PT states that she is doing well.  She is not moving as fast as she would like to be.      Patient Stated Goals  get rid of pain/improve    Currently in Pain?  Yes    Pain Score  2     Pain Location  Foot    Pain Orientation  Left;Lateral    Pain Descriptors / Indicators  Discomfort                       [facility] Adult PT Treatment/Exercise - 09/22/17 0001      Exercises   Exercises  Ankle      Manual Therapy   Manual Therapy  Soft tissue mobilization    Manual therapy comments  Manual complete separate rest of tx    Soft tissue mobilization  STM to L extensor digitorum brevis for decreased pain and restrictions      Ankle Exercises: Stretches   Gastroc Stretch  3 reps;30 seconds    Gastroc Stretch Limitations  slant board      Ankle Exercises: Standing   BAPS  Standing;Level 4    BAPS Limitations  DF/PF, inv/ev, CW/CCW    Vector Stance  Right;Left;3 reps;15 seconds    Rocker Board  2 minutes    Rocker Board Limitations  A/P, R/L x2 mins each    Heel Raises  Both;20 reps    Warrior III  --    Tai Chi  hip back diagonal B green tbanc x15    Other Standing Ankle Exercises  bil tandem stance foam head turnsx10 each    Other Standing Ankle Exercises  lunge  onto bosu                PT Short Term Goals - 09/20/17 1033      PT SHORT TERM GOAL #1   Title  Pt will have improved L ankle AROM for DF, PF, and inversion by 5 deg in order to maximize her gait and functional mobility.    Baseline  9/9: see ROM    Time  3    Period  Weeks    Status  Achieved      PT SHORT TERM GOAL #2   Title  Pt  will have 1/2 grade improvement throughout MMT in order to maximize gait and pt's functional mobility.    Baseline  9/9: see MMT    Time  3    Period  Weeks    Status  Partially Met      PT SHORT TERM GOAL #3   Title  Pt will be able to perform 5xSTS in 10 sec or < with proper mechanics to demo improved balance and functional strength.     Time  3    Period  Weeks    Status  Achieved      PT SHORT TERM GOAL #4   Title  Pt will have decreased edema of L ankle by 5cm in order to maximize ROM and pain.    Time  3    Period  Weeks    Status  Achieved        PT Long Term Goals - 09/20/17 1033      PT LONG TERM GOAL #1   Title  Pt will have improved L ankle AROM for DF and PF by 10deg or > in order to further maximize her gait and functional mobility.    Baseline  9/9: see ROM    Time  6    Period  Weeks    Status  Achieved      PT LONG TERM GOAL #2   Title  Pt will have 1 grade improvement throughout MMT in order to maximize gait on uneven ground and stair ambulation    Baseline  9/9: see MMT    Time  6    Period  Weeks    Status  Partially Met      PT LONG TERM GOAL #3   Title  Pt will be able to perform L SLS for 15 sec or > to demo improved functional strength, L ankle stability, and overall balance so as to maximize her gait and stair  ambulation.    Baseline  9/9: 7.6sec or <    Time  6    Period  Weeks    Status  On-going      PT LONG TERM GOAL #4   Title  Pt will be able to ambulate 678f during 3MWT with all necessary A/E, no AD, and gait WFL in order to maximize her community ambulation and her ability to perform work duties with greater ease, once cleared to RTW by her sPsychologist, sport and exercise     Baseline  9/9: 5839f L trendelenberg    Time  6    Period  Weeks    Status  On-going      PT LONG TERM GOAL #5   Title  Pt will report being able to sleep in her bed and awaken 1x/night or better due to L hip pain in order to maximize her overall recovery and demo improved L hip pain.    Baseline  9/9: reports this is better, sleeping in bed but still getting up due to other things, not L hip    Time  6    Period  Weeks    Status  Achieved            Plan - 09/22/17 1108    Clinical Impression Statement  Increased difficulty of Baps by increasing a level.  Attempeted to increase heel raises by lowering on Lt only but pt was unable to tolerate.  Added lunging onto bosu.   Pt continues to have decreased balance and increased pain.  Pt will  continue to benefit from skilled physical therapy.     Rehab Potential  Good    PT Frequency  3x / week   3x/week for 2 weeks, 2x/week for 4 weeks)    PT Duration  6 weeks    PT Treatment/Interventions  ADLs/Self Care Home Management;Aquatic Therapy;Cryotherapy;Electrical Stimulation;Moist Heat;Traction;Ultrasound;DME Instruction;Gait training;Stair training;Functional mobility training;Therapeutic activities;Therapeutic exercise;Balance training;Neuromuscular re-education;Patient/family education;Manual techniques;Orthotic Fit/Training;Scar mobilization;Passive range of motion;Dry needling;Energy conservation;Taping    PT Next Visit Plan  add Tree stance, progess to step ups on Bosu     PT Home Exercise Plan  eval: seated ABCs; hamstring and piriformis stretches 08/23/17 - heel slides, towel  inversion/eversion; 8/21: bridging, sidelying hip abd; 9/4: 4-way ankle with RTB    Consulted and Agree with Plan of Care  Patient       Patient will benefit from skilled therapeutic intervention in order to improve the following deficits and impairments:  Abnormal gait, Decreased activity tolerance, Decreased balance, Decreased endurance, Decreased mobility, Decreased range of motion, Decreased scar mobility, Decreased strength, Difficulty walking, Hypomobility, Increased edema, Increased fascial restricitons, Increased muscle spasms, Impaired flexibility, Impaired sensation, Improper body mechanics, Pain  Visit Diagnosis: Pain in left ankle and joints of left foot  Stiffness of left ankle, not elsewhere classified  Pain in left hip  Muscle weakness (generalized)  Other abnormalities of gait and mobility     Problem List Patient Active Problem List   Diagnosis Date Noted  . Postmenopausal bleeding 08/04/2017  . Overweight 08/04/2017  . Nausea 08/04/2017  . Menopausal syndrome 08/04/2017  . Lumbar spondylosis 08/04/2017  . Hypothyroidism 08/04/2017  . Hypercholesterolemia 08/04/2017  . Herpes simplex 08/04/2017  . Headache 08/04/2017  . Gastroesophageal reflux disease 08/04/2017  . Cystitis 08/04/2017  . Chronic constipation 08/04/2017  . Allergic rhinitis 08/04/2017  . Pain of left hand 06/11/2017  . Carpal tunnel syndrome of left wrist 06/11/2017  . Knee pain 01/13/1991    Rayetta Humphrey, PT CLT [phone] 09/22/2017, 11:21 AM  [facility] [address] [place], [place], [address] Phone: [phone]   Fax:  [phone]  Name: Laura Tapia MRN: [id number] Date of Birth: 08/12/1960

## 2017-09-25 IMAGING — MG 2D DIGITAL SCREENING BILATERAL MAMMOGRAM WITH CAD AND ADJUNCT TO
9 of 12 series · 9 of 28 positions shown · non-contrast
Comparison: Previous exam(s).

CLINICAL DATA: Screening.

EXAM:
2D DIGITAL SCREENING BILATERAL MAMMOGRAM WITH CAD AND ADJUNCT TOMO

[R MLO synth-2D]
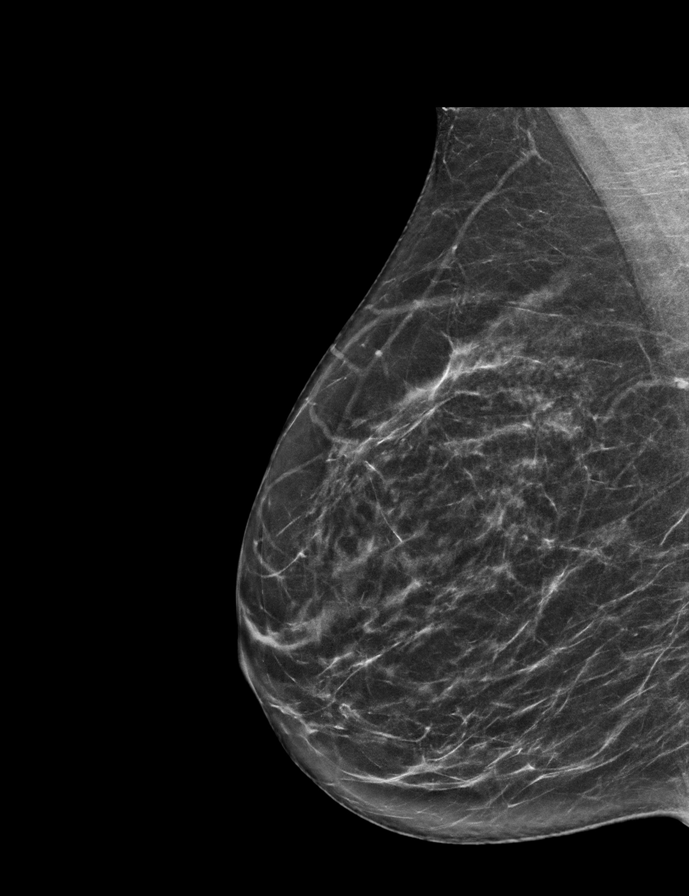

[L MLO synth-2D]
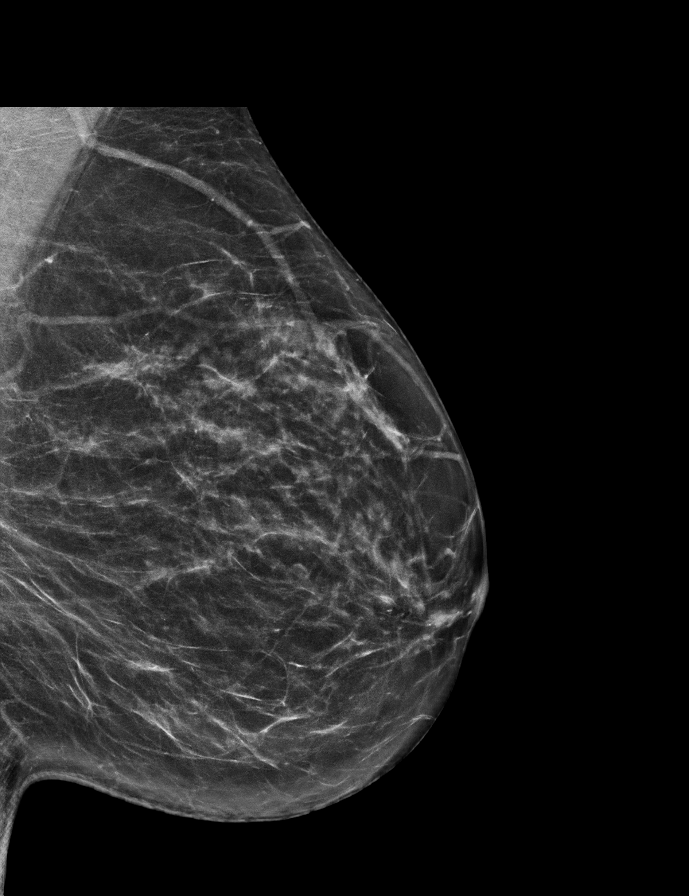

[L MLO]
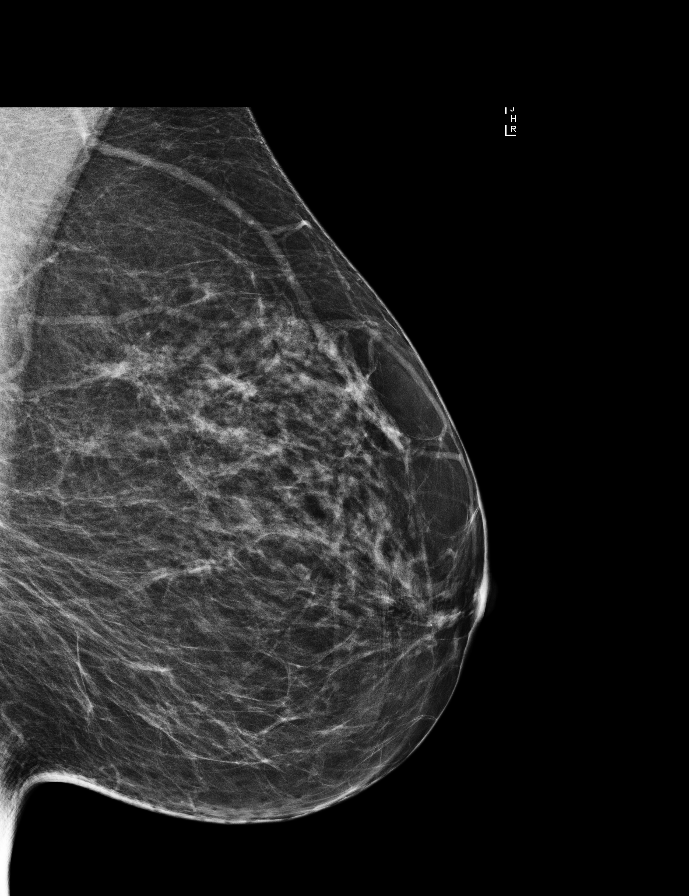

[L CC synth-2D]
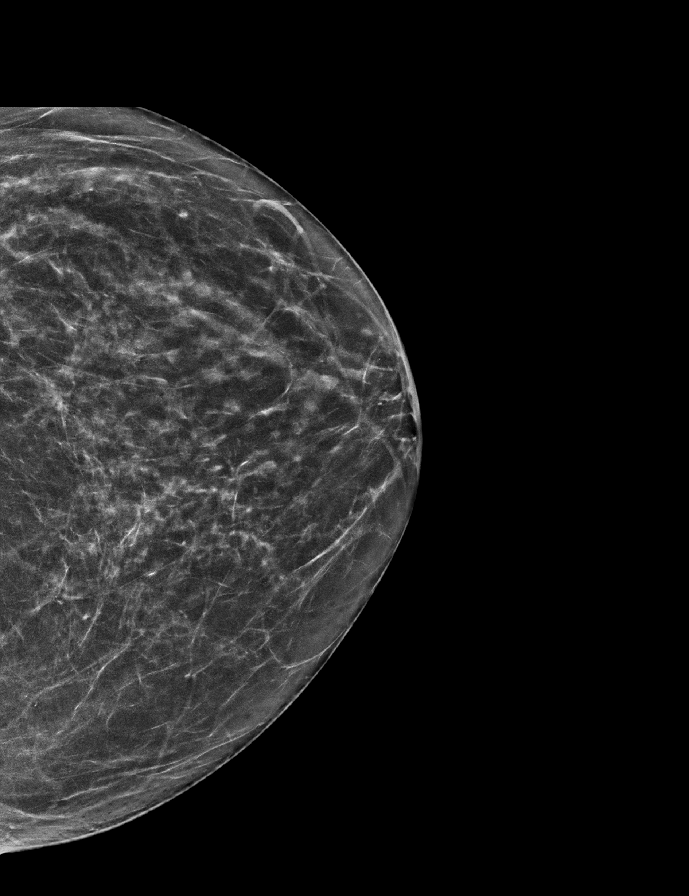

[R CC]
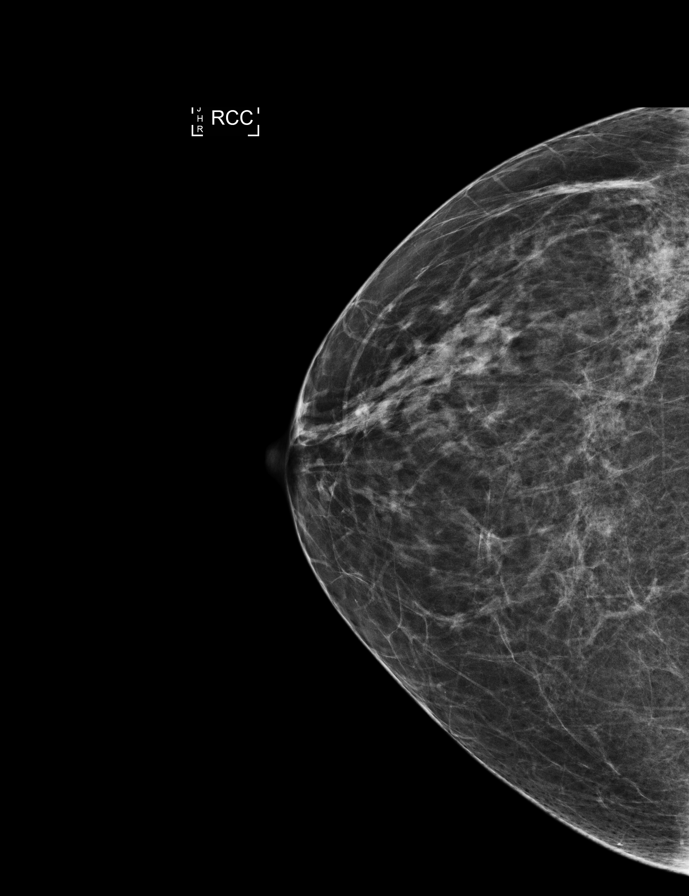

[R MLO]
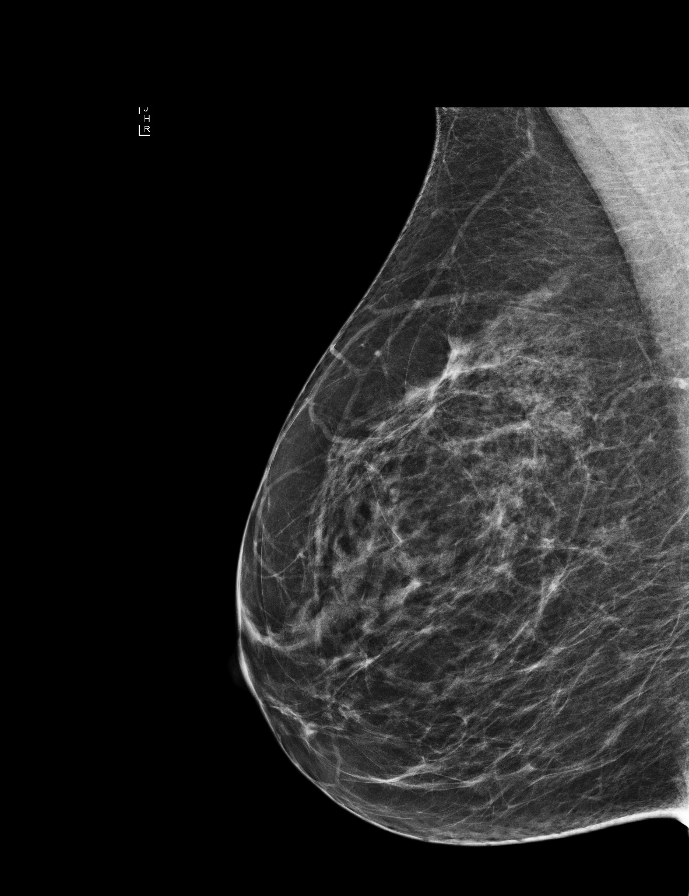

[R CC synth-2D]
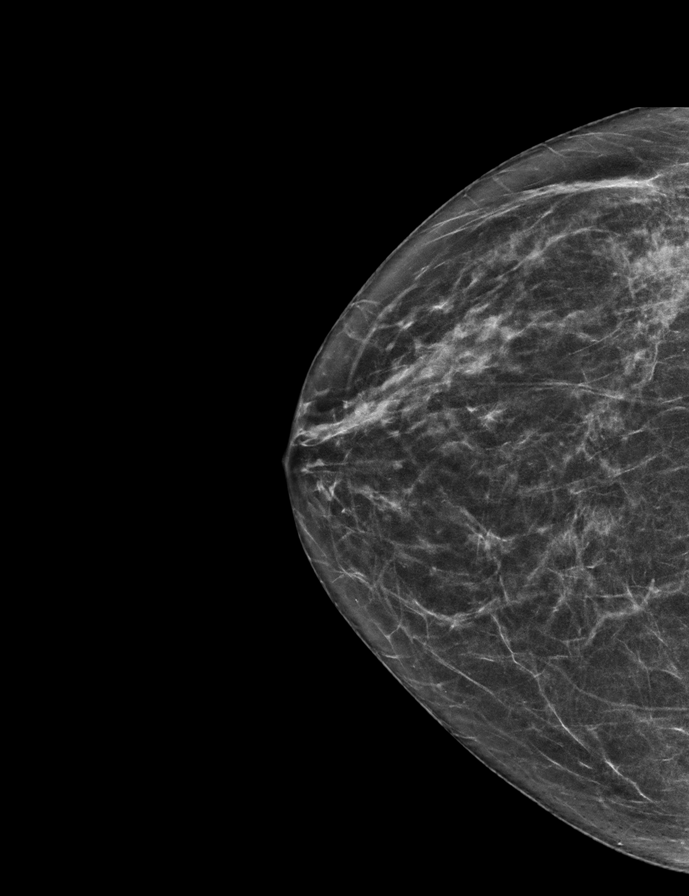

[L CC]
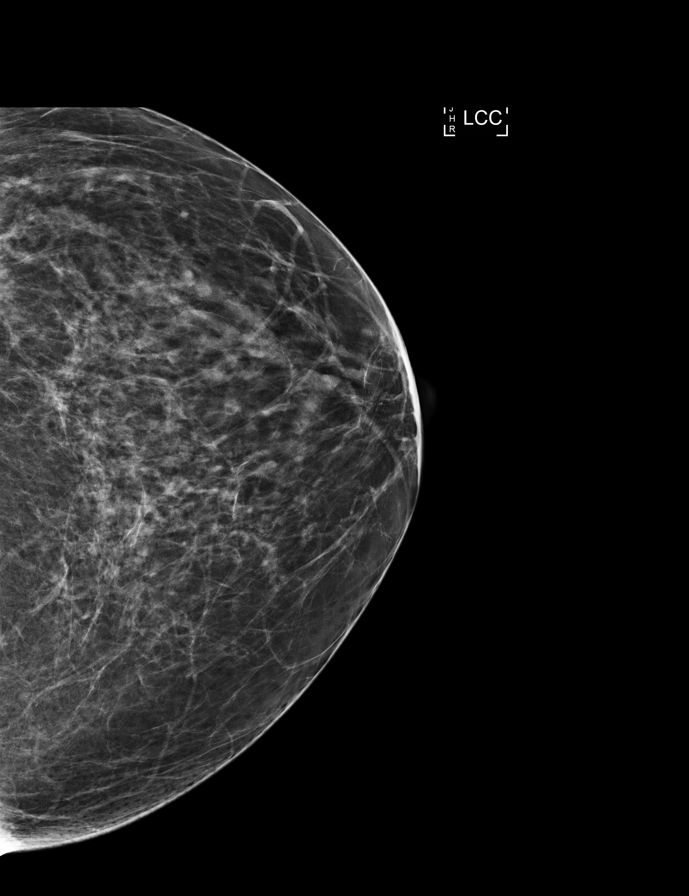

[R CC tomo · tomo slice 29/58.0]
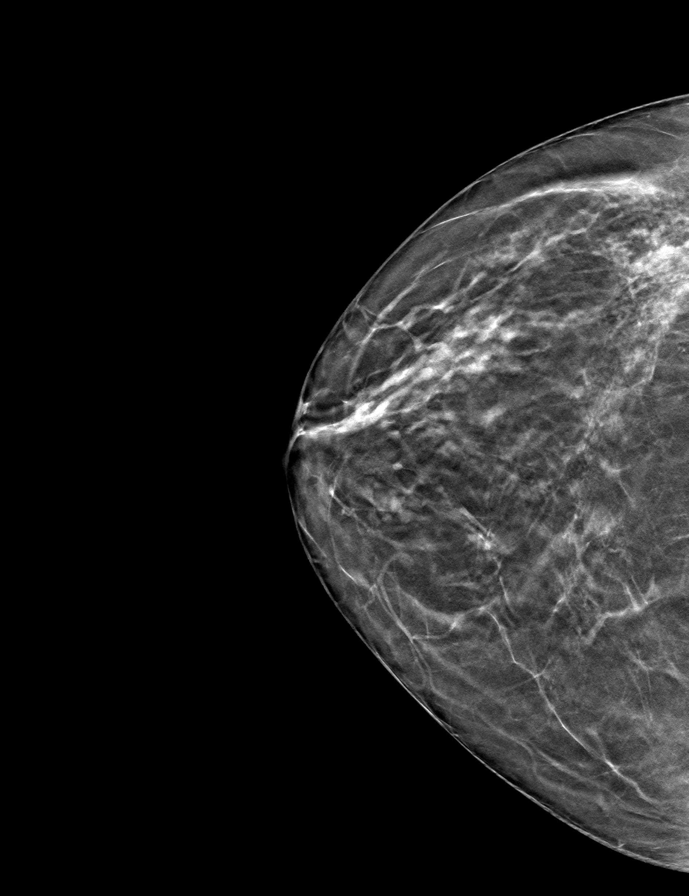

[9 of 28 positions shown; findings below may reference images not displayed]

ACR Breast Density Category b: There are scattered areas of
fibroglandular density.
FINDINGS: There are no findings suspicious for malignancy. Images were
processed with CAD.
IMPRESSION: No mammographic evidence of malignancy. A result letter of this
screening mammogram will be mailed directly to the patient.

RECOMMENDATION:
Screening mammogram in one year. (Code:97-6-RS4)

BI-RADS CATEGORY  1: Negative.

## 2017-09-29 ENCOUNTER — Encounter (HOSPITAL_COMMUNITY): Payer: Self-pay

## 2017-09-29 ENCOUNTER — Ambulatory Visit (HOSPITAL_COMMUNITY): Payer: 59

## 2017-09-29 DIAGNOSIS — M25572 Pain in left ankle and joints of left foot: Secondary | ICD-10-CM | POA: Diagnosis not present

## 2017-09-29 DIAGNOSIS — R2689 Other abnormalities of gait and mobility: Secondary | ICD-10-CM

## 2017-09-29 DIAGNOSIS — M25672 Stiffness of left ankle, not elsewhere classified: Secondary | ICD-10-CM

## 2017-09-29 DIAGNOSIS — M25552 Pain in left hip: Secondary | ICD-10-CM

## 2017-09-29 DIAGNOSIS — M6281 Muscle weakness (generalized): Secondary | ICD-10-CM

## 2017-09-29 NOTE — Therapy (Signed)
[place], [place], [address] Phone: [phone]   Fax:  [phone]  Physical Therapy Treatment  Patient Details  Name: Laura Tapia MRN: [id number] Date of Birth: 07-Aug-1960 Referring Provider: Edrick Kins, MD   Encounter Date: 09/29/2017  PT End of Session - 09/29/17 1300    Visit Number  15    Number of Visits  23    Date for PT Re-Evaluation  10/18/17    Authorization Type  [place] Time Period  08/09/17 to 09/20/17; NEW: 09/20/17 to 10/18/17    Authorization - Visit Number  15    Authorization - Number of Visits  60    PT Start Time  1300    PT Stop Time  1341    PT Time Calculation (min)  41 min    Activity Tolerance  Patient tolerated treatment well    Behavior During Therapy  [facility] for tasks assessed/performed       History reviewed. No pertinent past medical history.  History reviewed. No pertinent surgical history.  There were no vitals filed for this visit.  Subjective Assessment - 09/29/17 1301    Subjective  Pt reports that her ankle feels stiff but overall it is working itself out and improving. No pain, just stiffness.    Patient Stated Goals  get rid of pain/improve    Currently in Pain?  No/denies            [facility] Adult PT Treatment/Exercise - 09/29/17 0001      Exercises   Exercises  Ankle      Manual Therapy   Manual Therapy  Soft tissue mobilization    Manual therapy comments  Manual complete separate rest of tx    Soft tissue mobilization  STM to middle lateral plantar fascia/peroneal brevis insertion to decrease restrictions and pain      Ankle Exercises: Stretches   Gastroc Stretch  3 reps;30 seconds    Gastroc Stretch Limitations  slant board      Ankle Exercises: Standing   BAPS  Standing;Level 4;10 reps    BAPS Limitations  DF/PF, inv/ev, CW/CCW    SLS  LLE only on bosu (dome up) 10x max of 3"; LLE only on 1/2 foam roll upside down 10x10"  with intermittent UE support    Heel Raises  Both;20 reps    Heel Raises Limitations  x20 with weight shifted over to L with only R touching assist to increase eccentric control    Warrior I   fwd step ups on bosu x15 LLE    Warrior II  squatting on bosu (dome down) x15 BUE support    Warrior III  LLE tree stance with R toe on ground and then R foot on L ankle 10x holding as long as possible (max of 8")    Braiding (Round Trip)  fwd/retro resisted walking with purple band 70f x1RT each    Other Standing Ankle Exercises  lunging on bosu x15 BLE;             PT Education - 09/29/17 1300    Education Details  continue HEP, exercise technique    Person(s) Educated  Patient    Methods  Demonstration;Explanation    Comprehension  Verbalized understanding;Returned demonstration       PT Short Term Goals - 09/20/17 1033      PT SHORT TERM GOAL #1   Title  Pt will have  improved L ankle AROM for DF, PF, and inversion by 5 deg in order to maximize her gait and functional mobility.    Baseline  9/9: see ROM    Time  3    Period  Weeks    Status  Achieved      PT SHORT TERM GOAL #2   Title  Pt will have 1/2 grade improvement throughout MMT in order to maximize gait and pt's functional mobility.    Baseline  9/9: see MMT    Time  3    Period  Weeks    Status  Partially Met      PT SHORT TERM GOAL #3   Title  Pt will be able to perform 5xSTS in 10 sec or < with proper mechanics to demo improved balance and functional strength.     Time  3    Period  Weeks    Status  Achieved      PT SHORT TERM GOAL #4   Title  Pt will have decreased edema of L ankle by 5cm in order to maximize ROM and pain.    Time  3    Period  Weeks    Status  Achieved        PT Long Term Goals - 09/20/17 1033      PT LONG TERM GOAL #1   Title  Pt will have improved L ankle AROM for DF and PF by 10deg or > in order to further maximize her gait and functional mobility.    Baseline  9/9: see ROM    Time   6    Period  Weeks    Status  Achieved      PT LONG TERM GOAL #2   Title  Pt will have 1 grade improvement throughout MMT in order to maximize gait on uneven ground and stair ambulation    Baseline  9/9: see MMT    Time  6    Period  Weeks    Status  Partially Met      PT LONG TERM GOAL #3   Title  Pt will be able to perform L SLS for 15 sec or > to demo improved functional strength, L ankle stability, and overall balance so as to maximize her gait and stair ambulation.    Baseline  9/9: 7.6sec or <    Time  6    Period  Weeks    Status  On-going      PT LONG TERM GOAL #4   Title  Pt will be able to ambulate 612f during 3MWT with all necessary A/E, no AD, and gait WFL in order to maximize her community ambulation and her ability to perform work duties with greater ease, once cleared to RTW by her sPsychologist, sport and exercise     Baseline  9/9: 5828f L trendelenberg    Time  6    Period  Weeks    Status  On-going      PT LONG TERM GOAL #5   Title  Pt will report being able to sleep in her bed and awaken 1x/night or better due to L hip pain in order to maximize her overall recovery and demo improved L hip pain.    Baseline  9/9: reports this is better, sleeping in bed but still getting up due to other things, not L hip    Time  6    Period  Weeks    Status  Achieved  Plan - 09/29/17 1343    Clinical Impression Statement  Continued with focus on L ankle mobility, strength and dynamic balance. She still demo's weakness with PF, especially eccentric and single leg. Progressed her dynamic balance this date to improve ankle strength and balance. Pt tolerating well, but was challenged by it. Pt continues to report stiffness and some discomfort in ankle during some of the activities but feel this is due to weakness that remains. She is also c/o dorsal mid-foot pain which has started since last week; joint mobility felt to be WNL so PT also feels this is due to increase in her activity out of  the boot, but will continue to monitor and address as needed. Ended with manual for joint and soft tissue restrictions. Pt progressing nicely but needs continued skilled PT intervention to address remaining deficits in order to promote return to PLOF.    Rehab Potential  Good    PT Frequency  3x / week   3x/week for 2 weeks, 2x/week for 4 weeks)    PT Duration  6 weeks    PT Treatment/Interventions  ADLs/Self Care Home Management;Aquatic Therapy;Cryotherapy;Electrical Stimulation;Moist Heat;Traction;Ultrasound;DME Instruction;Gait training;Stair training;Functional mobility training;Therapeutic activities;Therapeutic exercise;Balance training;Neuromuscular re-education;Patient/family education;Manual techniques;Orthotic Fit/Training;Scar mobilization;Passive range of motion;Dry needling;Energy conservation;Taping    PT Next Visit Plan  continue Tree stance, step ups on Bosu, dynamic balance and strengthening for L ankle    PT Home Exercise Plan  eval: seated ABCs; hamstring and piriformis stretches 08/23/17 - heel slides, towel inversion/eversion; 8/21: bridging, sidelying hip abd; 9/4: 4-way ankle with RTB    Consulted and Agree with Plan of Care  Patient       Patient will benefit from skilled therapeutic intervention in order to improve the following deficits and impairments:  Abnormal gait, Decreased activity tolerance, Decreased balance, Decreased endurance, Decreased mobility, Decreased range of motion, Decreased scar mobility, Decreased strength, Difficulty walking, Hypomobility, Increased edema, Increased fascial restricitons, Increased muscle spasms, Impaired flexibility, Impaired sensation, Improper body mechanics, Pain  Visit Diagnosis: Pain in left ankle and joints of left foot  Stiffness of left ankle, not elsewhere classified  Pain in left hip  Muscle weakness (generalized)  Other abnormalities of gait and mobility     Problem List Patient Active Problem List   Diagnosis  Date Noted  . Postmenopausal bleeding 08/04/2017  . Overweight 08/04/2017  . Nausea 08/04/2017  . Menopausal syndrome 08/04/2017  . Lumbar spondylosis 08/04/2017  . Hypothyroidism 08/04/2017  . Hypercholesterolemia 08/04/2017  . Herpes simplex 08/04/2017  . Headache 08/04/2017  . Gastroesophageal reflux disease 08/04/2017  . Cystitis 08/04/2017  . Chronic constipation 08/04/2017  . Allergic rhinitis 08/04/2017  . Pain of left hand 06/11/2017  . Carpal tunnel syndrome of left wrist 06/11/2017  . Knee pain 01/13/1991      Geraldine Solar PT, DPT  [facility] [address] [place], [place], [address] Phone: [phone]   Fax:  [phone]  Name: CHELSEY REDONDO MRN: [id number] Date of Birth: 13-Dec-1960

## 2017-10-01 ENCOUNTER — Encounter (HOSPITAL_COMMUNITY): Payer: Self-pay

## 2017-10-01 ENCOUNTER — Ambulatory Visit (HOSPITAL_COMMUNITY): Payer: 59

## 2017-10-01 DIAGNOSIS — M25672 Stiffness of left ankle, not elsewhere classified: Secondary | ICD-10-CM

## 2017-10-01 DIAGNOSIS — M25572 Pain in left ankle and joints of left foot: Secondary | ICD-10-CM | POA: Diagnosis not present

## 2017-10-01 DIAGNOSIS — M6281 Muscle weakness (generalized): Secondary | ICD-10-CM

## 2017-10-01 DIAGNOSIS — M25552 Pain in left hip: Secondary | ICD-10-CM

## 2017-10-01 DIAGNOSIS — R2689 Other abnormalities of gait and mobility: Secondary | ICD-10-CM

## 2017-10-01 NOTE — Therapy (Signed)
[place], [place], [address] Phone: [phone]   Fax:  [phone]  Physical Therapy Treatment  Patient Details  Name: Laura Tapia MRN: [id number] Date of Birth: Jun 30, 1960 Referring Provider: Edrick Kins, MD   Encounter Date: 10/01/2017  PT End of Session - 10/01/17 0902    Visit Number  16    Number of Visits  23    Date for PT Re-Evaluation  10/18/17    Authorization Type  [place] Time Period  08/09/17 to 09/20/17; NEW: 09/20/17 to 10/18/17    Authorization - Visit Number  16    Authorization - Number of Visits  60    PT Start Time  0900    PT Stop Time  0941    PT Time Calculation (min)  41 min    Activity Tolerance  Patient tolerated treatment well    Behavior During Therapy  [facility] for tasks assessed/performed       History reviewed. No pertinent past medical history.  History reviewed. No pertinent surgical history.  There were no vitals filed for this visit.  Subjective Assessment - 10/01/17 0902    Subjective  Pt states her foot is still sore but not bad.     Patient Stated Goals  get rid of pain/improve    Currently in Pain?  No/denies            [facility] Adult PT Treatment/Exercise - 10/01/17 0001      Exercises   Exercises  Ankle      Manual Therapy   Manual Therapy  Soft tissue mobilization    Manual therapy comments  Manual complete separate rest of tx    Soft tissue mobilization  STM to peroneals, extensor digitorum brevis, middle lateral plantar fascia/peroneal brevis insertion to decrease restrictions and pain      Ankle Exercises: Stretches   Gastroc Stretch  3 reps;30 seconds    Gastroc Stretch Limitations  slant board      Ankle Exercises: Standing   BAPS  Standing;Level 4;10 reps    BAPS Limitations  DF/PF, inv/ev, CW/CCW    SLS  LLE on foam +palov press RTB x10reps    Rocker Board  2 minutes    Rocker Board Limitations  A/P, R/L x2 mins each     Heel Raises  Both;20 reps    Heel Raises Limitations  x20 with weight shifted over to L with only R touching assist to increase eccentric control    Warrior I   fwd step ups on bosu x15 LLE    Warrior III  LLE tree stance with R toe on ground, then R foot on L ankle, and then L shin 10x holding as long as possible (max of 8")    Other Standing Ankle Exercises  lunging on bosu x15 BLE (no UE)               PT Short Term Goals - 09/20/17 1033      PT SHORT TERM GOAL #1   Title  Pt will have improved L ankle AROM for DF, PF, and inversion by 5 deg in order to maximize her gait and functional mobility.    Baseline  9/9: see ROM    Time  3    Period  Weeks    Status  Achieved      PT SHORT TERM GOAL #2   Title  Pt will have 1/2 grade  improvement throughout MMT in order to maximize gait and pt's functional mobility.    Baseline  9/9: see MMT    Time  3    Period  Weeks    Status  Partially Met      PT SHORT TERM GOAL #3   Title  Pt will be able to perform 5xSTS in 10 sec or < with proper mechanics to demo improved balance and functional strength.     Time  3    Period  Weeks    Status  Achieved      PT SHORT TERM GOAL #4   Title  Pt will have decreased edema of L ankle by 5cm in order to maximize ROM and pain.    Time  3    Period  Weeks    Status  Achieved        PT Long Term Goals - 09/20/17 1033      PT LONG TERM GOAL #1   Title  Pt will have improved L ankle AROM for DF and PF by 10deg or > in order to further maximize her gait and functional mobility.    Baseline  9/9: see ROM    Time  6    Period  Weeks    Status  Achieved      PT LONG TERM GOAL #2   Title  Pt will have 1 grade improvement throughout MMT in order to maximize gait on uneven ground and stair ambulation    Baseline  9/9: see MMT    Time  6    Period  Weeks    Status  Partially Met      PT LONG TERM GOAL #3   Title  Pt will be able to perform L SLS for 15 sec or > to demo improved  functional strength, L ankle stability, and overall balance so as to maximize her gait and stair ambulation.    Baseline  9/9: 7.6sec or <    Time  6    Period  Weeks    Status  On-going      PT LONG TERM GOAL #4   Title  Pt will be able to ambulate 624f during 3MWT with all necessary A/E, no AD, and gait WFL in order to maximize her community ambulation and her ability to perform work duties with greater ease, once cleared to RTW by her sPsychologist, sport and exercise     Baseline  9/9: 5835f L trendelenberg    Time  6    Period  Weeks    Status  On-going      PT LONG TERM GOAL #5   Title  Pt will report being able to sleep in her bed and awaken 1x/night or better due to L hip pain in order to maximize her overall recovery and demo improved L hip pain.    Baseline  9/9: reports this is better, sleeping in bed but still getting up due to other things, not L hip    Time  6    Period  Weeks    Status  Achieved            Plan - 10/01/17 0941    Clinical Impression Statement  Continued with established POC focusing on mobility and overall strengthening of L ankle. Continued with BAPS and rockerboard for mobility, heel raises for calf strengthening, and BOSU work for dynamic stability and strengthening. Able to progress tree stance to mid shin today. Continued with manual for soft tissue  restrictions in lateral ankle and lateral plantar aspect of foot; noted improvements in restrictions, however, still remain and are still painful to palpation. Continue as planned, progressing as able.     Rehab Potential  Good    PT Frequency  3x / week   3x/week for 2 weeks, 2x/week for 4 weeks)    PT Duration  6 weeks    PT Treatment/Interventions  ADLs/Self Care Home Management;Aquatic Therapy;Cryotherapy;Electrical Stimulation;Moist Heat;Traction;Ultrasound;DME Instruction;Gait training;Stair training;Functional mobility training;Therapeutic activities;Therapeutic exercise;Balance training;Neuromuscular  re-education;Patient/family education;Manual techniques;Orthotic Fit/Training;Scar mobilization;Passive range of motion;Dry needling;Energy conservation;Taping    PT Next Visit Plan  continue Tree stance, step ups on Bosu, dynamic balance and strengthening for L ankle; continue manual for pain control    PT Home Exercise Plan  eval: seated ABCs; hamstring and piriformis stretches 08/23/17 - heel slides, towel inversion/eversion; 8/21: bridging, sidelying hip abd; 9/4: 4-way ankle with RTB    Consulted and Agree with Plan of Care  Patient       Patient will benefit from skilled therapeutic intervention in order to improve the following deficits and impairments:  Abnormal gait, Decreased activity tolerance, Decreased balance, Decreased endurance, Decreased mobility, Decreased range of motion, Decreased scar mobility, Decreased strength, Difficulty walking, Hypomobility, Increased edema, Increased fascial restricitons, Increased muscle spasms, Impaired flexibility, Impaired sensation, Improper body mechanics, Pain  Visit Diagnosis: Pain in left ankle and joints of left foot  Stiffness of left ankle, not elsewhere classified  Pain in left hip  Muscle weakness (generalized)  Other abnormalities of gait and mobility     Problem List Patient Active Problem List   Diagnosis Date Noted  . Postmenopausal bleeding 08/04/2017  . Overweight 08/04/2017  . Nausea 08/04/2017  . Menopausal syndrome 08/04/2017  . Lumbar spondylosis 08/04/2017  . Hypothyroidism 08/04/2017  . Hypercholesterolemia 08/04/2017  . Herpes simplex 08/04/2017  . Headache 08/04/2017  . Gastroesophageal reflux disease 08/04/2017  . Cystitis 08/04/2017  . Chronic constipation 08/04/2017  . Allergic rhinitis 08/04/2017  . Pain of left hand 06/11/2017  . Carpal tunnel syndrome of left wrist 06/11/2017  . Knee pain 01/13/1991        Geraldine Solar PT, DPT   [facility] [address] [place], [place], [address] Phone: [phone]   Fax:  [phone]  Name: Laura Tapia MRN: [id number] Date of Birth: 1960/08/16

## 2017-10-04 ENCOUNTER — Ambulatory Visit (HOSPITAL_COMMUNITY): Payer: 59 | Admitting: Physical Therapy

## 2017-10-04 ENCOUNTER — Telehealth (HOSPITAL_COMMUNITY): Payer: Self-pay | Admitting: Obstetrics & Gynecology

## 2017-10-04 NOTE — Telephone Encounter (Signed)
10/04/17  cx because she has a really bad migraine this morning

## 2017-10-06 ENCOUNTER — Ambulatory Visit (HOSPITAL_COMMUNITY): Payer: 59

## 2017-10-06 ENCOUNTER — Encounter (HOSPITAL_COMMUNITY): Payer: Self-pay

## 2017-10-06 DIAGNOSIS — M25552 Pain in left hip: Secondary | ICD-10-CM

## 2017-10-06 DIAGNOSIS — M6281 Muscle weakness (generalized): Secondary | ICD-10-CM

## 2017-10-06 DIAGNOSIS — M25672 Stiffness of left ankle, not elsewhere classified: Secondary | ICD-10-CM

## 2017-10-06 DIAGNOSIS — M25572 Pain in left ankle and joints of left foot: Secondary | ICD-10-CM | POA: Diagnosis not present

## 2017-10-06 DIAGNOSIS — R2689 Other abnormalities of gait and mobility: Secondary | ICD-10-CM

## 2017-10-06 NOTE — Therapy (Signed)
[place], [place], [address] Phone: [phone]   Fax:  [phone]  Physical Therapy Treatment  Patient Details  Name: SERAH NICOLETTI MRN: [id number] Date of Birth: 01/27/1960 Referring Provider: Edrick Kins, MD   Encounter Date: 10/06/2017  PT End of Session - 10/06/17 0814    Visit Number  17    Number of Visits  23    Date for PT Re-Evaluation  10/18/17    Authorization Type  [employer]    Authorization Time Period  08/09/17 to 09/20/17; NEW: 09/20/17 to 10/18/17    Authorization - Visit Number  17    Authorization - Number of Visits  60    PT Start Time  0814    Activity Tolerance  Patient tolerated treatment well    Behavior During Therapy  [facility] for tasks assessed/performed       History reviewed. No pertinent past medical history.  History reviewed. No pertinent surgical history.  There were no vitals filed for this visit.  Subjective Assessment - 10/06/17 0814    Subjective  She states that her foot is still bothering her but feels it is getting better. She is still working with her golf ball on the bottom lateral portion of her foot.    Patient Stated Goals  get rid of pain/improve    Currently in Pain?  No/denies            [facility] PT Assessment - 10/06/17 0001      AROM   Left Ankle Dorsiflexion  3    Left Ankle Plantar Flexion  60    Left Ankle Inversion  18    Left Ankle Eversion  20           [facility] Adult PT Treatment/Exercise - 10/06/17 0001      Exercises   Exercises  Ankle      Manual Therapy   Manual Therapy  Soft tissue mobilization    Manual therapy comments  Manual complete separate rest of tx    Soft tissue mobilization  STM to middle lateral plantar fascia/peroneal brevis insertion to decrease restrictions and pain      Ankle Exercises: Stretches   Soleus Stretch  2 reps;30 seconds    Soleus Stretch Limitations  on step, knee bent    Gastroc Stretch  2  reps;30 seconds    Gastroc Stretch Limitations  on step, knee straight      Ankle Exercises: Machines for Strengthening   Cybex Leg Press  PF on multigym leg press: 3x10 reps 20# with BLE; LLE only 10# x20 reps      Ankle Exercises: Standing   Heel Raises  Both;20 reps    Heel Raises Limitations  with more weight on L foot     Warrior III  LLE tree stance with R toe on ground, then R foot on L ankle, and then L shin 10x holding as long as possible (max of 8")    Side Shuffle (Round Trip)  SLS EC foam 10x (max of 3")    Tai Chi  SLS foam +ball toss 2x20tosses BLE    Other Standing Ankle Exercises  fwd/retro tandem gait x2RT each on foam           PT Education - 10/06/17 0814    Education Details  exercise technique    Person(s) Educated  Patient    Methods  Explanation;Demonstration    Comprehension  Verbalized understanding;Returned demonstration  PT Short Term Goals - 09/20/17 1033      PT SHORT TERM GOAL #1   Title  Pt will have improved L ankle AROM for DF, PF, and inversion by 5 deg in order to maximize her gait and functional mobility.    Baseline  9/9: see ROM    Time  3    Period  Weeks    Status  Achieved      PT SHORT TERM GOAL #2   Title  Pt will have 1/2 grade improvement throughout MMT in order to maximize gait and pt's functional mobility.    Baseline  9/9: see MMT    Time  3    Period  Weeks    Status  Partially Met      PT SHORT TERM GOAL #3   Title  Pt will be able to perform 5xSTS in 10 sec or < with proper mechanics to demo improved balance and functional strength.     Time  3    Period  Weeks    Status  Achieved      PT SHORT TERM GOAL #4   Title  Pt will have decreased edema of L ankle by 5cm in order to maximize ROM and pain.    Time  3    Period  Weeks    Status  Achieved        PT Long Term Goals - 09/20/17 1033      PT LONG TERM GOAL #1   Title  Pt will have improved L ankle AROM for DF and PF by 10deg or > in order to further  maximize her gait and functional mobility.    Baseline  9/9: see ROM    Time  6    Period  Weeks    Status  Achieved      PT LONG TERM GOAL #2   Title  Pt will have 1 grade improvement throughout MMT in order to maximize gait on uneven ground and stair ambulation    Baseline  9/9: see MMT    Time  6    Period  Weeks    Status  Partially Met      PT LONG TERM GOAL #3   Title  Pt will be able to perform L SLS for 15 sec or > to demo improved functional strength, L ankle stability, and overall balance so as to maximize her gait and stair ambulation.    Baseline  9/9: 7.6sec or <    Time  6    Period  Weeks    Status  On-going      PT LONG TERM GOAL #4   Title  Pt will be able to ambulate 669f during 3MWT with all necessary A/E, no AD, and gait WFL in order to maximize her community ambulation and her ability to perform work duties with greater ease, once cleared to RTW by her sPsychologist, sport and exercise     Baseline  9/9: 5854f L trendelenberg    Time  6    Period  Weeks    Status  On-going      PT LONG TERM GOAL #5   Title  Pt will report being able to sleep in her bed and awaken 1x/night or better due to L hip pain in order to maximize her overall recovery and demo improved L hip pain.    Baseline  9/9: reports this is better, sleeping in bed but still getting up due to other things, not  L hip    Time  6    Period  Weeks    Status  Achieved            Plan - 10/06/17 3943    Clinical Impression Statement  Pt reporting overall slow and steady progress with less pain in her forefoot during amb. Initiated soleus stretching today as well as PF on multigym leg press machine for calf strengthening. Continued with dynamic stability and dynamic strengthening with foam exercises. Pt challenged with ball toss during SLS on foam as well as with LSS foam EC. Ended with manual STM to middle, lateral plantar aspect of foot; restrictions are noted to have decreased but still remain and recreate her pain.  Updated AROM measurements today as she sees Dr. Amalia Hailey on Monday 10/11/17 and sent him the note. Pt due for reassessment next visit.     Rehab Potential  Good    PT Frequency  3x / week   3x/week for 2 weeks, 2x/week for 4 weeks)    PT Duration  6 weeks    PT Treatment/Interventions  ADLs/Self Care Home Management;Aquatic Therapy;Cryotherapy;Electrical Stimulation;Moist Heat;Traction;Ultrasound;DME Instruction;Gait training;Stair training;Functional mobility training;Therapeutic activities;Therapeutic exercise;Balance training;Neuromuscular re-education;Patient/family education;Manual techniques;Orthotic Fit/Training;Scar mobilization;Passive range of motion;Dry needling;Energy conservation;Taping    PT Next Visit Plan  reassess; continue Tree stance, step ups on Bosu, dynamic balance and strengthening for L ankle; continue manual for pain control    PT Home Exercise Plan  eval: seated ABCs; hamstring and piriformis stretches 08/23/17 - heel slides, towel inversion/eversion; 8/21: bridging, sidelying hip abd; 9/4: 4-way ankle with RTB    Consulted and Agree with Plan of Care  Patient       Patient will benefit from skilled therapeutic intervention in order to improve the following deficits and impairments:  Abnormal gait, Decreased activity tolerance, Decreased balance, Decreased endurance, Decreased mobility, Decreased range of motion, Decreased scar mobility, Decreased strength, Difficulty walking, Hypomobility, Increased edema, Increased fascial restricitons, Increased muscle spasms, Impaired flexibility, Impaired sensation, Improper body mechanics, Pain  Visit Diagnosis: Pain in left ankle and joints of left foot  Stiffness of left ankle, not elsewhere classified  Pain in left hip  Muscle weakness (generalized)  Other abnormalities of gait and mobility     Problem List Patient Active Problem List   Diagnosis Date Noted  . Postmenopausal bleeding 08/04/2017  . Overweight 08/04/2017   . Nausea 08/04/2017  . Menopausal syndrome 08/04/2017  . Lumbar spondylosis 08/04/2017  . Hypothyroidism 08/04/2017  . Hypercholesterolemia 08/04/2017  . Herpes simplex 08/04/2017  . Headache 08/04/2017  . Gastroesophageal reflux disease 08/04/2017  . Cystitis 08/04/2017  . Chronic constipation 08/04/2017  . Allergic rhinitis 08/04/2017  . Pain of left hand 06/11/2017  . Carpal tunnel syndrome of left wrist 06/11/2017  . Knee pain 01/13/1991       Geraldine Solar PT, DPT  [facility] [address] [place], [place], [address] Phone: [phone]   Fax:  [phone]  Name: ELAIJAH MUNOZ MRN: [id number] Date of Birth: Jan 16, 1960

## 2017-10-11 ENCOUNTER — Telehealth: Payer: Self-pay | Admitting: Podiatry

## 2017-10-11 ENCOUNTER — Ambulatory Visit (INDEPENDENT_AMBULATORY_CARE_PROVIDER_SITE_OTHER): Payer: 59

## 2017-10-11 ENCOUNTER — Ambulatory Visit (INDEPENDENT_AMBULATORY_CARE_PROVIDER_SITE_OTHER): Payer: 59 | Admitting: Podiatry

## 2017-10-11 ENCOUNTER — Encounter: Payer: Self-pay | Admitting: Podiatry

## 2017-10-11 DIAGNOSIS — M76822 Posterior tibial tendinitis, left leg: Secondary | ICD-10-CM

## 2017-10-11 DIAGNOSIS — Z9889 Other specified postprocedural states: Secondary | ICD-10-CM

## 2017-10-11 NOTE — Telephone Encounter (Signed)
I called the pt to let her know that I have typed her note and I will have it at the front desk for her to pick up when she comes back tomorrow to see Raiford Noble. Pt thanked me for calling her and letting her know.

## 2017-10-11 NOTE — Progress Notes (Signed)
   Subjective:  Patient presents today status post PT tendon repair with navicular exostectomy left. DOS: 07/08/17. She states she is doing well overall.  Patient has approximately 1 more week of physical therapy.  She says that the physical therapy is helping.  Patient currently has no pain to the surgical site.  She does have some lateral foot pain which may be secondary to compensation.  Patient is set to return to work on 10/15/2017.   No past medical history on file.    Objective/Physical Exam eurovascular status intact.  Skin incisions appear to be well coapted. No sign of infectious process noted. No dehiscence. No active bleeding noted.  Negative for any significant edema noted.  Radiographic Exam:  Osteotomies sites appear to be stable with routine healing.   Assessment: 1. s/p PT tendon repair with navicular exostectomy left. DOS: 07/08/17   Plan of Care:  1. Patient was evaluated. X-Rays reviewed.  2.  Continue wearing good supportive shoe gear.  Patient can return to full activity no restrictions.  Patient sent to return to work on 10/15/2017 with no restrictions. 3.  Recommend good supportive shoe gear.  Patient does have an old pair of orthotics that do not seem to be supporting her arches of her bilateral feet. 4.  Appointment with Raiford Noble, Pedorthist for custom molded orthotics 5.  Return to clinic as needed  Felecia Shelling, DPM [facility]  Dr. Felecia Shelling, DPM    [address]                                        [place], [place] [address]                Office [phone]  Fax [phone]

## 2017-10-11 NOTE — Telephone Encounter (Signed)
I saw Dr. Logan Bores this morning for a postop visit and he is releasing me to go back to work next Monday, 07 October. I forgot to ask and get a note. I need note saying its okay to return to work on 07 October. If someone could get that ready for me, I'm coming back in tomorrow to see Raiford Noble about some orthotics and I can pick that up then. Any questions, give me a call back at [phone]. Thank you so much.

## 2017-10-12 ENCOUNTER — Encounter (HOSPITAL_COMMUNITY): Payer: Self-pay

## 2017-10-12 ENCOUNTER — Ambulatory Visit (HOSPITAL_COMMUNITY): Payer: 59 | Attending: Podiatry

## 2017-10-12 ENCOUNTER — Ambulatory Visit (INDEPENDENT_AMBULATORY_CARE_PROVIDER_SITE_OTHER): Payer: 59 | Admitting: Orthotics

## 2017-10-12 ENCOUNTER — Telehealth: Payer: Self-pay | Admitting: Podiatry

## 2017-10-12 DIAGNOSIS — Z9889 Other specified postprocedural states: Secondary | ICD-10-CM

## 2017-10-12 DIAGNOSIS — R2689 Other abnormalities of gait and mobility: Secondary | ICD-10-CM

## 2017-10-12 DIAGNOSIS — M25672 Stiffness of left ankle, not elsewhere classified: Secondary | ICD-10-CM | POA: Diagnosis not present

## 2017-10-12 DIAGNOSIS — M779 Enthesopathy, unspecified: Secondary | ICD-10-CM

## 2017-10-12 DIAGNOSIS — M25572 Pain in left ankle and joints of left foot: Secondary | ICD-10-CM | POA: Diagnosis not present

## 2017-10-12 DIAGNOSIS — M6281 Muscle weakness (generalized): Secondary | ICD-10-CM | POA: Diagnosis present

## 2017-10-12 DIAGNOSIS — M76822 Posterior tibial tendinitis, left leg: Secondary | ICD-10-CM

## 2017-10-12 DIAGNOSIS — M25552 Pain in left hip: Secondary | ICD-10-CM | POA: Diagnosis not present

## 2017-10-12 DIAGNOSIS — M47816 Spondylosis without myelopathy or radiculopathy, lumbar region: Secondary | ICD-10-CM | POA: Diagnosis not present

## 2017-10-12 NOTE — Progress Notes (Signed)
Patient presents with history of PTTD/Pes Planus (acquired).  Patient demonstrated medial shift talus/drop navicular, and medial column collapse.   Plan is for CMFO w/ longitudinal arch support, rear foot stability to address eversion.  Fabrication will be with semi rigid/rigid poly pro shell, deep heel seat, wide, padding under spenco cover. Plan on    levy                                  To fabricate.

## 2017-10-12 NOTE — Telephone Encounter (Signed)
Left message for pt to call to confirm she wants to proceed with the orthotics. That per insurance company since she has met her out of pocket max of 3000.00 they are covered at 100%.

## 2017-10-12 NOTE — Telephone Encounter (Signed)
Pt called back and wants to proceed with orthotics.

## 2017-10-12 NOTE — Therapy (Signed)
[facility], [place], [address] Phone: [phone]   Fax:  [phone]   Progress Note Reporting Period 10/12/17 to 11/09/17  See note below for Objective Data and Assessment of Progress/Goals.    Physical Therapy Treatment  Patient Details  Name: Laura Tapia MRN: [id number] Date of Birth: 08/23/1960 Referring Provider (PT): Edrick Kins, MD   Encounter Date: 10/12/2017  PT End of Session - 10/12/17 1117    Visit Number  18    Number of Visits  23    Date for PT Re-Evaluation  11/09/17    Authorization Type  [place] Time Period  08/09/17 to 09/20/17; NEW: 09/20/17 to 10/18/17; NEW: 10/12/17 to 11/09/17 (4-week HEP POC)    Authorization - Visit Number  18    Authorization - Number of Visits  60    PT Start Time  1117    PT Stop Time  1138    PT Time Calculation (min)  21 min    Activity Tolerance  Patient tolerated treatment well    Behavior During Therapy  [facility] for tasks assessed/performed       History reviewed. No pertinent past medical history.  History reviewed. No pertinent surgical history.  There were no vitals filed for this visit.  Subjective Assessment - 10/12/17 1119    Subjective  Pt reports that she is feeling okay. She is going back to work on Monday and she is getting re-fit for orthotics today.     Patient Stated Goals  get rid of pain/improve    Currently in Pain?  No/denies         [facility] PT Assessment - 10/12/17 0001      Assessment   Medical Diagnosis  Left hip bursitis, postop Left posterior tendon repair    Referring Provider (PT)  Edrick Kins, MD    Onset Date/Surgical Date  07/08/17    Next MD Visit  no f/u unless she has a problem      AROM   Left Ankle Dorsiflexion  5   was 3   Left Ankle Plantar Flexion  60   was 60   Left Ankle Inversion  18   was 18   Left Ankle Eversion  20   was 20     Strength   Right Hip Extension  4/5   was 4    Right Hip ABduction  4+/5   was 4   Left Hip Extension  4/5   was 4   Left Hip ABduction  4+/5   was 4     Ambulation/Gait   Ambulation Distance (Feet)  890 Feet   3MWT; was 574f   Assistive device  None    Gait Pattern  Within Functional Limits      Balance   Balance Assessed  Yes      Static Standing Balance   Static Standing - Balance Support  No upper extremity supported    Static Standing Balance -  Activities   Single Leg Stance - Left Leg    Static Standing - Comment/# of Minutes  L: 8.8sec          PT Education - 10/12/17 1117    Education Details  reassessment findings    Person(s) Educated  Patient    Methods  Explanation;Demonstration    Comprehension  Verbalized understanding;Returned demonstration       PT Short Term Goals - 10/12/17 1120  PT SHORT TERM GOAL #1   Title  Pt will have improved L ankle AROM for DF, PF, and inversion by 5 deg in order to maximize her gait and functional mobility.    Baseline  9/9: see ROM    Time  3    Period  Weeks    Status  Achieved      PT SHORT TERM GOAL #2   Title  Pt will have 1/2 grade improvement throughout MMT in order to maximize gait and pt's functional mobility.    Baseline  10/1: see MMT    Time  3    Period  Weeks    Status  Partially Met      PT SHORT TERM GOAL #3   Title  Pt will be able to perform 5xSTS in 10 sec or < with proper mechanics to demo improved balance and functional strength.     Time  3    Period  Weeks    Status  Achieved      PT SHORT TERM GOAL #4   Title  Pt will have decreased edema of L ankle by 5cm in order to maximize ROM and pain.    Time  3    Period  Weeks    Status  Achieved        PT Long Term Goals - 10/12/17 1120      PT LONG TERM GOAL #1   Title  Pt will have improved L ankle AROM for DF and PF by 10deg or > in order to further maximize her gait and functional mobility.    Baseline  9/9: see ROM    Time  6    Period  Weeks    Status  Achieved       PT LONG TERM GOAL #2   Title  Pt will have 1 grade improvement throughout MMT in order to maximize gait on uneven ground and stair ambulation    Baseline  10/1: see MMT    Time  6    Period  Weeks    Status  Partially Met      PT LONG TERM GOAL #3   Title  Pt will be able to perform L SLS for 15 sec or > to demo improved functional strength, L ankle stability, and overall balance so as to maximize her gait and stair ambulation.    Baseline  10/1: 8.8sec or <    Time  6    Period  Weeks    Status  On-going      PT LONG TERM GOAL #4   Title  Pt will be able to ambulate 670f during 3MWT with all necessary A/E, no AD, and gait WFL in order to maximize her community ambulation and her ability to perform work duties with greater ease, once cleared to RTW by her sPsychologist, sport and exercise     Baseline  10/1: 8966f gait WFL    Time  6    Period  Weeks    Status  Achieved      PT LONG TERM GOAL #5   Title  Pt will report being able to sleep in her bed and awaken 1x/night or better due to L hip pain in order to maximize her overall recovery and demo improved L hip pain.    Baseline  9/9: reports this is better, sleeping in bed but still getting up due to other things, not L hip    Time  6    Period  Weeks    Status  Achieved            Plan - 10/12/17 1148    Clinical Impression Statement  PT reassessed pt's goals and outcome measures this date. Pt has made great progress towards goals as illustrated above. Her overall AROM has significantly improved as she had 5deg DF compared to -15deg at eval. Her SLS and proximal hip strength are still limited but her 3MWT significantly improved with gait WFL. Her overall pain has significantly improved as well as she is only having minor pain at the lateral aspect of the foot when she is walking. Pt was cleared to RTW on Monday 10/18/17. PT recommending placing pt on 4-week HEP POC for her to independently manage her strengthening and balance as she currently has no  more skilled PT needs; however, if her L hip or L foot pain flare up once she has returned to work, she was educated that she could return within that 4-week window for a reassessment and she verbalized understanding.     Rehab Potential  Good    PT Frequency  3x / week   3x/week for 2 weeks, 2x/week for 4 weeks)    PT Duration  6 weeks    PT Treatment/Interventions  ADLs/Self Care Home Management;Aquatic Therapy;Cryotherapy;Electrical Stimulation;Moist Heat;Traction;Ultrasound;DME Instruction;Gait training;Stair training;Functional mobility training;Therapeutic activities;Therapeutic exercise;Balance training;Neuromuscular re-education;Patient/family education;Manual techniques;Orthotic Fit/Training;Scar mobilization;Passive range of motion;Dry needling;Energy conservation;Taping    PT Next Visit Plan  4-week HEP POC    PT Home Exercise Plan  eval: seated ABCs; hamstring and piriformis stretches 08/23/17 - heel slides, towel inversion/eversion; 8/21: bridging, sidelying hip abd; 9/4: 4-way ankle with RTB; 10/1: educated to focus on balance and hip strength    Consulted and Agree with Plan of Care  Patient       Patient will benefit from skilled therapeutic intervention in order to improve the following deficits and impairments:  Abnormal gait, Decreased activity tolerance, Decreased balance, Decreased endurance, Decreased mobility, Decreased range of motion, Decreased scar mobility, Decreased strength, Difficulty walking, Hypomobility, Increased edema, Increased fascial restricitons, Increased muscle spasms, Impaired flexibility, Impaired sensation, Improper body mechanics, Pain  Visit Diagnosis: Pain in left ankle and joints of left foot - Plan: PT plan of care cert/re-cert  Stiffness of left ankle, not elsewhere classified - Plan: PT plan of care cert/re-cert  Pain in left hip - Plan: PT plan of care cert/re-cert  Muscle weakness (generalized) - Plan: PT plan of care cert/re-cert  Other  abnormalities of gait and mobility - Plan: PT plan of care cert/re-cert     Problem List Patient Active Problem List   Diagnosis Date Noted  . Postmenopausal bleeding 08/04/2017  . Overweight 08/04/2017  . Nausea 08/04/2017  . Menopausal syndrome 08/04/2017  . Lumbar spondylosis 08/04/2017  . Hypothyroidism 08/04/2017  . Hypercholesterolemia 08/04/2017  . Herpes simplex 08/04/2017  . Headache 08/04/2017  . Gastroesophageal reflux disease 08/04/2017  . Cystitis 08/04/2017  . Chronic constipation 08/04/2017  . Allergic rhinitis 08/04/2017  . Pain of left hand 06/11/2017  . Carpal tunnel syndrome of left wrist 06/11/2017  . Knee pain 01/13/1991       Geraldine Solar PT, DPT  [facility] [address] [place], [place], [address] Phone: [phone]   Fax:  [phone]  Name: LYNDI HOLBEIN MRN: [id number] Date of Birth: March 27, 1960

## 2017-11-01 ENCOUNTER — Ambulatory Visit: Payer: 59 | Admitting: Orthotics

## 2017-11-01 DIAGNOSIS — Z9889 Other specified postprocedural states: Secondary | ICD-10-CM

## 2017-11-01 DIAGNOSIS — M76822 Posterior tibial tendinitis, left leg: Secondary | ICD-10-CM

## 2017-11-01 NOTE — Progress Notes (Signed)
Patient came in today to pick up custom made foot orthotics.  The goals were accomplished and the patient reported no dissatisfaction with said orthotics.  Patient was advised of breakin period and how to report any issues. 

## 2017-11-09 ENCOUNTER — Ambulatory Visit (HOSPITAL_COMMUNITY): Payer: 59

## 2017-11-09 ENCOUNTER — Encounter (HOSPITAL_COMMUNITY): Payer: Self-pay

## 2017-11-09 DIAGNOSIS — M6281 Muscle weakness (generalized): Secondary | ICD-10-CM

## 2017-11-09 DIAGNOSIS — M25572 Pain in left ankle and joints of left foot: Secondary | ICD-10-CM | POA: Diagnosis not present

## 2017-11-09 DIAGNOSIS — R2689 Other abnormalities of gait and mobility: Secondary | ICD-10-CM

## 2017-11-09 DIAGNOSIS — H9319 Tinnitus, unspecified ear: Secondary | ICD-10-CM | POA: Diagnosis not present

## 2017-11-09 DIAGNOSIS — M25552 Pain in left hip: Secondary | ICD-10-CM

## 2017-11-09 DIAGNOSIS — M25672 Stiffness of left ankle, not elsewhere classified: Secondary | ICD-10-CM

## 2017-11-09 NOTE — Therapy (Signed)
[facility] [address] [place], [place], [address] Phone: [phone]   Fax:  [phone]   PHYSICAL THERAPY DISCHARGE SUMMARY  Visits from Start of Care: 19  Current functional level related to goals / functional outcomes: See below   Remaining deficits: See below   Education / Equipment: conitnue HEP  Plan: Patient agrees to discharge.  Patient goals were partially met. Patient is being discharged due to meeting the stated rehab goals.  ?????     Physical Therapy Treatment  Patient Details  Name: Laura Tapia MRN: [id number] Date of Birth: May 30, 1960 Referring Provider (PT): Edrick Kins, MD   Encounter Date: 11/09/2017  PT End of Session - 11/09/17 1508    Visit Number  19    Number of Visits  23    Date for PT Re-Evaluation  11/09/17    Authorization Type  [place] Time Period  08/09/17 to 09/20/17; NEW: 09/20/17 to 10/18/17; NEW: 10/12/17 to 11/09/17 (4-week HEP POC)    Authorization - Visit Number  19    Authorization - Number of Visits  60    PT Start Time  8546    PT Stop Time  1453    PT Time Calculation (min)  16 min    Activity Tolerance  Patient tolerated treatment well    Behavior During Therapy  [facility] for tasks assessed/performed       History reviewed. No pertinent past medical history.  History reviewed. No pertinent surgical history.  There were no vitals filed for this visit.  Subjective Assessment - 11/09/17 1439    Subjective  Pt states that she is overall doing well. She states that she is still having some soreness at surgical site, mainly by the end of the day. She states that the lateral portion of her foot is sore by the end of the day. Her great toe is still sore at the MTP joint.     Patient Stated Goals  get rid of pain/improve    Currently in Pain?  No/denies           [facility] PT Assessment - 11/09/17 0001      AROM   Left Ankle Dorsiflexion  3    Left Ankle  Plantar Flexion  60    Left Ankle Inversion  17    Left Ankle Eversion  20            PT Education - 11/09/17 1509    Education Details  d/c plan    Person(s) Educated  Patient    Methods  Explanation    Comprehension  Verbalized understanding       PT Short Term Goals - 10/12/17 1120      PT SHORT TERM GOAL #1   Title  Pt will have improved L ankle AROM for DF, PF, and inversion by 5 deg in order to maximize her gait and functional mobility.    Baseline  9/9: see ROM    Time  3    Period  Weeks    Status  Achieved      PT SHORT TERM GOAL #2   Title  Pt will have 1/2 grade improvement throughout MMT in order to maximize gait and pt's functional mobility.    Baseline  10/1: see MMT    Time  3    Period  Weeks    Status  Partially Met      PT SHORT TERM  GOAL #3   Title  Pt will be able to perform 5xSTS in 10 sec or < with proper mechanics to demo improved balance and functional strength.     Time  3    Period  Weeks    Status  Achieved      PT SHORT TERM GOAL #4   Title  Pt will have decreased edema of L ankle by 5cm in order to maximize ROM and pain.    Time  3    Period  Weeks    Status  Achieved        PT Long Term Goals - 10/12/17 1120      PT LONG TERM GOAL #1   Title  Pt will have improved L ankle AROM for DF and PF by 10deg or > in order to further maximize her gait and functional mobility.    Baseline  9/9: see ROM    Time  6    Period  Weeks    Status  Achieved      PT LONG TERM GOAL #2   Title  Pt will have 1 grade improvement throughout MMT in order to maximize gait on uneven ground and stair ambulation    Baseline  10/1: see MMT    Time  6    Period  Weeks    Status  Partially Met      PT LONG TERM GOAL #3   Title  Pt will be able to perform L SLS for 15 sec or > to demo improved functional strength, L ankle stability, and overall balance so as to maximize her gait and stair ambulation.    Baseline  10/1: 8.8sec or <    Time  6    Period   Weeks    Status  On-going      PT LONG TERM GOAL #4   Title  Pt will be able to ambulate 690f during 3MWT with all necessary A/E, no AD, and gait WFL in order to maximize her community ambulation and her ability to perform work duties with greater ease, once cleared to RTW by her sPsychologist, sport and exercise     Baseline  10/1: 894f gait WFL    Time  6    Period  Weeks    Status  Achieved      PT LONG TERM GOAL #5   Title  Pt will report being able to sleep in her bed and awaken 1x/night or better due to L hip pain in order to maximize her overall recovery and demo improved L hip pain.    Baseline  9/9: reports this is better, sleeping in bed but still getting up due to other things, not L hip    Time  6    Period  Weeks    Status  Achieved            Plan - 11/09/17 1508    Clinical Impression Statement  Pt returns today following 1-34-monthP POC. She reports that work has been going well, she has been performing her HEP, and she also received new orthotics which have made a huge improvement with her soreness during gait and standing at work, etc. Her AROM has maintained very well (see above), but she was c/o increased soreness in her foot, mainly at the end of the day, at her great toe and lateral aspect of foot. PT palpated all along her ankle joint and surrounding mm with no recreation of pain or restrictions noted; joint  mobility felt good as well. PT feels this soreness is just her mm fatiguing from being back at work full-time and feel this will gradually improve as her mm functional strength and endurance improve. Dr. Amalia Hailey released her at her last f/u with him and this PT feels pt is ready for d/c as well. No additions made to HEP; educated pt to call if she needed anything and that she could always return with referral if needed.     Rehab Potential  Good    PT Frequency  3x / week   3x/week for 2 weeks, 2x/week for 4 weeks)    PT Duration  6 weeks    PT Treatment/Interventions  ADLs/Self Care  Home Management;Aquatic Therapy;Cryotherapy;Electrical Stimulation;Moist Heat;Traction;Ultrasound;DME Instruction;Gait training;Stair training;Functional mobility training;Therapeutic activities;Therapeutic exercise;Balance training;Neuromuscular re-education;Patient/family education;Manual techniques;Orthotic Fit/Training;Scar mobilization;Passive range of motion;Dry needling;Energy conservation;Taping    PT Next Visit Plan  d/c    PT Home Exercise Plan  eval: seated ABCs; hamstring and piriformis stretches 08/23/17 - heel slides, towel inversion/eversion; 8/21: bridging, sidelying hip abd; 9/4: 4-way ankle with RTB; 10/1: educated to focus on balance and hip strength    Consulted and Agree with Plan of Care  Patient       Patient will benefit from skilled therapeutic intervention in order to improve the following deficits and impairments:  Abnormal gait, Decreased activity tolerance, Decreased balance, Decreased endurance, Decreased mobility, Decreased range of motion, Decreased scar mobility, Decreased strength, Difficulty walking, Hypomobility, Increased edema, Increased fascial restricitons, Increased muscle spasms, Impaired flexibility, Impaired sensation, Improper body mechanics, Pain  Visit Diagnosis: Pain in left ankle and joints of left foot  Stiffness of left ankle, not elsewhere classified  Pain in left hip  Muscle weakness (generalized)  Other abnormalities of gait and mobility     Problem List Patient Active Problem List   Diagnosis Date Noted  . Postmenopausal bleeding 08/04/2017  . Overweight 08/04/2017  . Nausea 08/04/2017  . Menopausal syndrome 08/04/2017  . Lumbar spondylosis 08/04/2017  . Hypothyroidism 08/04/2017  . Hypercholesterolemia 08/04/2017  . Herpes simplex 08/04/2017  . Headache 08/04/2017  . Gastroesophageal reflux disease 08/04/2017  . Cystitis 08/04/2017  . Chronic constipation 08/04/2017  . Allergic rhinitis 08/04/2017  . Pain of left hand  06/11/2017  . Carpal tunnel syndrome of left wrist 06/11/2017  . Knee pain 01/13/1991       Geraldine Solar PT, DPT  [facility] [address] [place], [place], [address] Phone: [phone]   Fax:  [phone]  Name: JHANIYA BRISKI MRN: [id number] Date of Birth: 10-Aug-1960

## 2017-12-08 ENCOUNTER — Ambulatory Visit: Payer: 59 | Admitting: Podiatry

## 2017-12-13 DIAGNOSIS — Z8371 Family history of colonic polyps: Secondary | ICD-10-CM | POA: Diagnosis not present

## 2017-12-13 DIAGNOSIS — K317 Polyp of stomach and duodenum: Secondary | ICD-10-CM | POA: Diagnosis not present

## 2017-12-13 DIAGNOSIS — K219 Gastro-esophageal reflux disease without esophagitis: Secondary | ICD-10-CM | POA: Diagnosis not present

## 2017-12-13 DIAGNOSIS — R1310 Dysphagia, unspecified: Secondary | ICD-10-CM | POA: Diagnosis not present

## 2017-12-27 DIAGNOSIS — G5602 Carpal tunnel syndrome, left upper limb: Secondary | ICD-10-CM | POA: Diagnosis not present

## 2017-12-27 DIAGNOSIS — M79642 Pain in left hand: Secondary | ICD-10-CM | POA: Diagnosis not present

## 2017-12-28 DIAGNOSIS — G43009 Migraine without aura, not intractable, without status migrainosus: Secondary | ICD-10-CM | POA: Diagnosis not present

## 2017-12-28 DIAGNOSIS — Z0000 Encounter for general adult medical examination without abnormal findings: Secondary | ICD-10-CM | POA: Diagnosis not present

## 2017-12-28 DIAGNOSIS — I10 Essential (primary) hypertension: Secondary | ICD-10-CM | POA: Diagnosis not present

## 2018-01-03 ENCOUNTER — Ambulatory Visit (INDEPENDENT_AMBULATORY_CARE_PROVIDER_SITE_OTHER): Payer: 59 | Admitting: Podiatry

## 2018-01-03 ENCOUNTER — Ambulatory Visit (INDEPENDENT_AMBULATORY_CARE_PROVIDER_SITE_OTHER): Payer: 59

## 2018-01-03 ENCOUNTER — Other Ambulatory Visit: Payer: Self-pay | Admitting: Podiatry

## 2018-01-03 ENCOUNTER — Encounter: Payer: Self-pay | Admitting: Podiatry

## 2018-01-03 DIAGNOSIS — M205X2 Other deformities of toe(s) (acquired), left foot: Secondary | ICD-10-CM | POA: Diagnosis not present

## 2018-01-03 DIAGNOSIS — M19072 Primary osteoarthritis, left ankle and foot: Secondary | ICD-10-CM

## 2018-01-03 DIAGNOSIS — M79675 Pain in left toe(s): Secondary | ICD-10-CM

## 2018-01-08 NOTE — Progress Notes (Signed)
   HPI: 57 year old female presenting today status post PT tendon repair with navicular exostectomy left. DOS: 07/08/17. She states she is doing well. She reports an improvement in the pain from surgery. She does report worsening pain around the dorsal hallux that was present even prior to surgery. She states she has difficulty flexing the toe. Walking increases the pain. She has been taking Meloxicam for the pain that she normally takes for arthritis of the knee. Patient is here for further evaluation and treatment.   History reviewed. No pertinent past medical history.   Physical Exam: General: The patient is alert and oriented x3 in no acute distress.  Dermatology: Skin is warm, dry and supple bilateral lower extremities. Negative for open lesions or macerations.  Vascular: Palpable pedal pulses bilaterally. No edema or erythema noted. Capillary refill within normal limits.  Neurological: Epicritic and protective threshold grossly intact bilaterally.   Musculoskeletal Exam: Pain on palpation with limited range of motion noted to the first MPJ left foot.  Radiographic Exam: Degenerative changes noted with joint space narrowing first MPJ. There also appears to be extra-articular spurring noted about the joint.     Assessment: 1. S/p PT tendon repair with navicular exostectomy left. DOS: 07/08/17 - resolved 2. 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 Patient evaluated. X-Rays reviewed.  2. Injection of 0.5 mLs Celestone Soluspan injected into the 1st MPJ of the left foot.  3. Continue taking Meloxicam daily as prescribed for arthritic knee conditions.  4. Surgical vs conservative treatment options discussed with patient. We will continue to pursue conservative treatment at the moment.  5. Recommended good shoe gear.  6. Return to clinic as needed.       Felecia ShellingBrent M. Belanna Manring, DPM [facility]  Dr. Felecia ShellingBrent M. Nazaire Cordial, DPM    [address]                                         [place], [place] [id number]                Office [phone]  Fax [phone]

## 2018-01-18 DIAGNOSIS — M2550 Pain in unspecified joint: Secondary | ICD-10-CM | POA: Diagnosis not present

## 2018-01-18 DIAGNOSIS — M533 Sacrococcygeal disorders, not elsewhere classified: Secondary | ICD-10-CM | POA: Diagnosis not present

## 2018-01-18 DIAGNOSIS — M459 Ankylosing spondylitis of unspecified sites in spine: Secondary | ICD-10-CM | POA: Diagnosis not present

## 2018-01-18 DIAGNOSIS — M47816 Spondylosis without myelopathy or radiculopathy, lumbar region: Secondary | ICD-10-CM | POA: Diagnosis not present

## 2018-03-22 DIAGNOSIS — M545 Low back pain, unspecified: Secondary | ICD-10-CM | POA: Insufficient documentation

## 2018-03-28 ENCOUNTER — Ambulatory Visit: Payer: 59 | Admitting: Podiatry

## 2018-04-12 DIAGNOSIS — M5136 Other intervertebral disc degeneration, lumbar region: Secondary | ICD-10-CM | POA: Insufficient documentation

## 2018-04-18 DIAGNOSIS — M419 Scoliosis, unspecified: Secondary | ICD-10-CM | POA: Insufficient documentation

## 2018-05-09 ENCOUNTER — Ambulatory Visit: Payer: 59 | Admitting: Podiatry

## 2018-05-11 ENCOUNTER — Ambulatory Visit: Payer: 59 | Admitting: Podiatry

## 2018-05-11 ENCOUNTER — Other Ambulatory Visit: Payer: Self-pay

## 2018-05-11 VITALS — Temp 97.9°F

## 2018-05-11 DIAGNOSIS — M205X2 Other deformities of toe(s) (acquired), left foot: Secondary | ICD-10-CM | POA: Diagnosis not present

## 2018-05-11 DIAGNOSIS — M19072 Primary osteoarthritis, left ankle and foot: Secondary | ICD-10-CM

## 2018-05-11 NOTE — Progress Notes (Signed)
   HPI:PT tendon repair with navicular exostectomy left. DOS: 07/08/17. She states she is doing well. She reports an improvement in the pain from surgery. Patient presents today due to a flareup of her hallux limitus to the left foot.  Patient received an injection on 01/03/2018 which helped to significantly to alleviate her symptoms to the joint.  She is requesting another injection and to discuss different treatment options  No past medical history on file.   Physical Exam: General: The patient is alert and oriented x3 in no acute distress.  Dermatology: Skin is warm, dry and supple bilateral lower extremities. Negative for open lesions or macerations.  Vascular: Palpable pedal pulses bilaterally. No edema or erythema noted. Capillary refill within normal limits.  Neurological: Epicritic and protective threshold grossly intact bilaterally.   Musculoskeletal Exam: Pain on palpation with limited range of motion noted to the first MPJ left foot.    Assessment: 1. S/p PT tendon repair with navicular exostectomy left. DOS: 07/08/17 - resolved 2. 1st MPJ DJD/Hallux limitus/capsulitis left    Plan of Care:  1. Patient evaluated. X-Rays reviewed.  2. Injection of 0.5 mLs Celestone Soluspan injected into the 1st MPJ of the left foot.  3. Continue taking Meloxicam daily as prescribed for arthritic knee conditions from her rheumatologist.  4. Surgical vs conservative treatment options discussed with patient. We will continue to pursue conservative treatment at the moment.  5. Recommended good shoe gear.  6. Return to clinic as needed.       Felecia Shelling, DPM [facility]  Dr. Felecia Shelling, DPM    [address] [place], [place] [address]                Office [phone]  Fax [phone]

## 2018-07-12 ENCOUNTER — Other Ambulatory Visit: Payer: Self-pay | Admitting: Obstetrics & Gynecology

## 2018-07-12 DIAGNOSIS — Z1231 Encounter for screening mammogram for malignant neoplasm of breast: Secondary | ICD-10-CM

## 2018-09-05 ENCOUNTER — Ambulatory Visit: Payer: 59

## 2018-10-14 ENCOUNTER — Ambulatory Visit
Admission: RE | Admit: 2018-10-14 | Discharge: 2018-10-14 | Disposition: A | Discharge status: Home / Self Care | Payer: 59 | Source: Ambulatory Visit | Attending: Obstetrics & Gynecology | Admitting: Obstetrics & Gynecology

## 2018-10-14 ENCOUNTER — Other Ambulatory Visit: Payer: Self-pay

## 2018-10-14 DIAGNOSIS — Z1231 Encounter for screening mammogram for malignant neoplasm of breast: Secondary | ICD-10-CM

## 2018-10-28 ENCOUNTER — Ambulatory Visit: Payer: 59 | Admitting: Podiatry

## 2018-10-28 ENCOUNTER — Other Ambulatory Visit: Payer: Self-pay

## 2018-10-28 ENCOUNTER — Encounter: Payer: Self-pay | Admitting: Podiatry

## 2018-10-28 ENCOUNTER — Ambulatory Visit (INDEPENDENT_AMBULATORY_CARE_PROVIDER_SITE_OTHER): Payer: 59

## 2018-10-28 DIAGNOSIS — L84 Corns and callosities: Secondary | ICD-10-CM

## 2018-10-28 DIAGNOSIS — M7752 Other enthesopathy of left foot: Secondary | ICD-10-CM

## 2018-10-28 DIAGNOSIS — M898X9 Other specified disorders of bone, unspecified site: Secondary | ICD-10-CM | POA: Diagnosis not present

## 2018-10-31 NOTE — Progress Notes (Signed)
   HPI: 58 year old female presenting today with a chief complaint of tenderness noted to the 4th interdigital space of the left foot that began about 6 weeks ago. She reports a callused area caused by the toes rubbing together. Wearing shoes increases the pain. She has not had any treatment for the symptoms.  She also reports some continued pain in the 1st MPJ of the left foot secondary to hallux limitus and capsulitis. She has received an injection in the past and states it helped provide relief. She has not had any recent treatment. There are no modifying factors noted. Patient is here for further evaluation and treatment.    No past medical history on file.   Physical Exam: General: The patient is alert and oriented x3 in no acute distress.  Dermatology: Hyperkeratotic tissue noted to the 4th interdigital webspace of the left foot. Skin is warm, dry and supple bilateral lower extremities. Negative for open lesions or macerations.  Vascular: Palpable pedal pulses bilaterally. No edema or erythema noted. Capillary refill within normal limits.  Neurological: Epicritic and protective threshold grossly intact bilaterally.   Musculoskeletal Exam: Pain on palpation with limited range of motion noted to the first MPJ left foot.  Radiographic Exam:  Normal osseous mineralization. Joint spaces preserved. No fracture/dislocation/boney destruction.     Assessment: 1. S/p PT tendon repair with navicular exostectomy left. DOS: 07/08/17 - resolved 2. 1st MPJ DJD/Hallux limitus/capsulitis left  3. Heloma Molle left    Plan of Care:  1. Patient evaluated. X-Rays reviewed.  2. Injection of 0.5 mLs Celestone Soluspan injected into the 1st MPJ of the left foot.  3. Excisional debridement of Heloma Molle left using a chisel blade was performed without incident. Light dressing applied.  4. Recommended OTC corn and callus remover.  5. Return to clinic as needed.        Edrick Kins, DPM [facility]  Dr. Edrick Kins, DPM    [address], [place] [address]                Office [phone]  Fax [phone]

## 2019-05-03 ENCOUNTER — Other Ambulatory Visit: Payer: Self-pay

## 2019-05-03 ENCOUNTER — Ambulatory Visit (INDEPENDENT_AMBULATORY_CARE_PROVIDER_SITE_OTHER): Payer: 59

## 2019-05-03 ENCOUNTER — Other Ambulatory Visit: Payer: Self-pay | Admitting: Podiatry

## 2019-05-03 ENCOUNTER — Ambulatory Visit: Payer: 59 | Admitting: Podiatry

## 2019-05-03 DIAGNOSIS — M79672 Pain in left foot: Secondary | ICD-10-CM | POA: Diagnosis not present

## 2019-05-03 DIAGNOSIS — M7752 Other enthesopathy of left foot: Secondary | ICD-10-CM

## 2019-05-03 MED ORDER — METHYLPREDNISOLONE 4 MG PO TBPK: ORAL_TABLET | ORAL | 0 refills | Status: DC

## 2019-05-08 NOTE — Progress Notes (Signed)
   HPI: 59 y.o. female presenting today with a chief complaint of throbbing pain to the left dorsal foot that began one week ago. Moving the foot increases the pain. She has been taking Meloxicam for treatment. She denies any known trauma or injury. Patient is here for further evaluation and treatment.   No past medical history on file.   Physical Exam: General: The patient is alert and oriented x3 in no acute distress.  Dermatology: Pain with palpation noted to the extensor tendon sheath of the left foot. Skin is warm, dry and supple bilateral lower extremities. Negative for open lesions or macerations.  Vascular: Palpable pedal pulses bilaterally. No edema or erythema noted. Capillary refill within normal limits.  Neurological: Epicritic and protective threshold grossly intact bilaterally.   Musculoskeletal Exam: Range of motion within normal limits to all pedal and ankle joints bilateral. Muscle strength 5/5 in all groups bilateral.   Radiographic Exam:  Normal osseous mineralization. Joint spaces preserved. No fracture/dislocation/boney destruction.    Assessment: 1. S/p PT tendon repair with navicular exostectomy left. DOS: 07/08/2017 2. Extensor tendinitis left    Plan of Care:  1. Patient evaluated. X-Rays reviewed.  2. Injection of 0.5 mLs Celestone Soluspan injected into the extensor tendon sheath of the left foot.  3. Prescription for Medrol Dose Pak provided to patient. Then continue taking Meloxicam.  4. Recommended good shoe gear.  5. Return to clinic as needed.       Laura Tapia, DPM [facility]  Dr. Felecia Tapia, DPM    [address] [place], [place] [address]                Office [phone]  Fax [phone]

## 2019-08-01 ENCOUNTER — Other Ambulatory Visit: Payer: Self-pay | Admitting: Obstetrics & Gynecology

## 2019-08-01 DIAGNOSIS — Z1231 Encounter for screening mammogram for malignant neoplasm of breast: Secondary | ICD-10-CM

## 2019-10-20 ENCOUNTER — Other Ambulatory Visit: Payer: Self-pay

## 2019-10-20 ENCOUNTER — Ambulatory Visit
Admission: RE | Admit: 2019-10-20 | Discharge: 2019-10-20 | Disposition: A | Discharge status: Home / Self Care | Payer: 59 | Source: Ambulatory Visit | Attending: Obstetrics & Gynecology | Admitting: Obstetrics & Gynecology

## 2019-10-20 DIAGNOSIS — Z1231 Encounter for screening mammogram for malignant neoplasm of breast: Secondary | ICD-10-CM

## 2020-01-19 ENCOUNTER — Ambulatory Visit: Payer: 59 | Admitting: Podiatry

## 2020-02-09 ENCOUNTER — Other Ambulatory Visit: Payer: Self-pay | Admitting: Internal Medicine

## 2020-02-09 DIAGNOSIS — R0989 Other specified symptoms and signs involving the circulatory and respiratory systems: Secondary | ICD-10-CM

## 2020-04-19 ENCOUNTER — Ambulatory Visit (INDEPENDENT_AMBULATORY_CARE_PROVIDER_SITE_OTHER): Payer: 59

## 2020-04-19 ENCOUNTER — Encounter: Payer: Self-pay | Admitting: Podiatry

## 2020-04-19 ENCOUNTER — Other Ambulatory Visit: Payer: Self-pay

## 2020-04-19 ENCOUNTER — Ambulatory Visit: Payer: 59 | Admitting: Podiatry

## 2020-04-19 DIAGNOSIS — M19072 Primary osteoarthritis, left ankle and foot: Secondary | ICD-10-CM

## 2020-04-19 DIAGNOSIS — M659 Synovitis and tenosynovitis, unspecified: Secondary | ICD-10-CM

## 2020-04-19 MED ORDER — BETAMETHASONE SOD PHOS & ACET 6 (3-3) MG/ML IJ SUSP: 3.0000 mg | Freq: Once | INTRAMUSCULAR | Status: DC

## 2020-04-19 NOTE — Progress Notes (Signed)
   HPI: 60 y.o. female presenting today for a new complaint regarding left lateral ankle pain is been going on for several months.  Patient states that she wears her current orthotics but by the end of the day she has pain to the inside of her foot and outside of her ankle.  She denies a history of trauma.  She has not done anything for treatment.  She presents for further treatment evaluation   No past medical history on file.   Physical Exam: General: The patient is alert and oriented x3 in no acute distress.  Dermatology: Pain with palpation noted to the extensor tendon sheath of the left foot. Skin is warm, dry and supple bilateral lower extremities. Negative for open lesions or macerations.  Vascular: Palpable pedal pulses bilaterally. No edema or erythema noted. Capillary refill within normal limits.  Neurological: Epicritic and protective threshold grossly intact bilaterally.   Musculoskeletal Exam: Range of motion within normal limits to all pedal and ankle joints bilateral. Muscle strength 5/5 in all groups bilateral.  Pain on palpation to the lateral aspect of the left ankle joint  Radiographic Exam:  Normal osseous mineralization. No fracture/dislocation/boney destruction.  Diffuse degenerative changes noted with joint space narrowing to the pedal joints of the foot.  Increased radiolucency throughout the entire x-ray concerning for possible osteopenia and demineralization  Assessment: 1. S/p PT tendon repair with navicular exostectomy left. DOS: 07/08/2017 2.  Synovitis/capsulitis of left ankle lateral aspect   Plan of Care:  1. Patient evaluated. X-Rays reviewed.  2. Injection of 0.5 mLs Celestone Soluspan injected into the left ankle joint 3.  Appointment with Pedorthist for new custom molded orthotics 4.  Continue wearing good supportive shoe gear 5.  Return to clinic as needed      Laura Tapia, DPM [facility]  Dr. Felecia Tapia, DPM    [address] [place], [place] [address]                Office [phone]  Fax [phone]

## 2020-05-09 ENCOUNTER — Telehealth: Payer: Self-pay | Admitting: Podiatry

## 2020-05-09 NOTE — Telephone Encounter (Signed)
pts husband called and left message to cxl her appt for new orthotics for tomorrow as they have contacted the insurance and they only cover them every 3 yrs.  I returned call and spoke to husband to confirm we are cxling the appt and explained that if she wanted her current ones refurbished it would be 90.00 and to drop them off at the office and pay the 90.00 and I would call when they come back in, usually takes about 2 wks. He said thank you so much and is going to discuss with pt.

## 2020-05-10 ENCOUNTER — Other Ambulatory Visit: Payer: 59

## 2020-07-05 ENCOUNTER — Telehealth: Payer: Self-pay | Admitting: Podiatry

## 2020-07-05 ENCOUNTER — Other Ambulatory Visit: Payer: Self-pay | Admitting: Internal Medicine

## 2020-07-05 DIAGNOSIS — Z1231 Encounter for screening mammogram for malignant neoplasm of breast: Secondary | ICD-10-CM

## 2020-07-05 NOTE — Telephone Encounter (Signed)
Patient would like her office notes from 04/19/20 printed out so she can pick them up. She plans to look over them with her rheumatologist.

## 2020-07-31 ENCOUNTER — Other Ambulatory Visit: Payer: Self-pay | Admitting: Rheumatology

## 2020-07-31 DIAGNOSIS — E2839 Other primary ovarian failure: Secondary | ICD-10-CM

## 2020-10-18 ENCOUNTER — Other Ambulatory Visit: Payer: Self-pay

## 2020-10-18 ENCOUNTER — Other Ambulatory Visit: Payer: 59

## 2020-10-18 DIAGNOSIS — M659 Synovitis and tenosynovitis, unspecified: Secondary | ICD-10-CM

## 2020-10-18 DIAGNOSIS — M19072 Primary osteoarthritis, left ankle and foot: Secondary | ICD-10-CM

## 2020-10-25 ENCOUNTER — Ambulatory Visit
Admission: RE | Admit: 2020-10-25 | Discharge: 2020-10-25 | Disposition: A | Discharge status: Home / Self Care | Payer: 59 | Source: Ambulatory Visit | Attending: Internal Medicine | Admitting: Internal Medicine

## 2020-10-25 DIAGNOSIS — Z1231 Encounter for screening mammogram for malignant neoplasm of breast: Secondary | ICD-10-CM

## 2020-11-06 ENCOUNTER — Telehealth: Payer: Self-pay | Admitting: Podiatry

## 2020-11-06 NOTE — Telephone Encounter (Signed)
Pt left message stating she wanted to just pick the orthotics up but needed our hours of operation..  I returned call and left message our hours are 8 to 5 Monday thru Friday closed for lunch from 12 to 1pm daily.

## 2020-11-06 NOTE — Telephone Encounter (Signed)
Orthotics in... lvm for pt to call to discuss if she wanted and appt or if she wanted to just pick them up since she has had them before.

## 2020-11-11 ENCOUNTER — Telehealth: Payer: Self-pay | Admitting: Podiatry

## 2020-11-11 NOTE — Telephone Encounter (Signed)
Pt left message stating she picked up the orthotics in Friday and they are too long and will not fit in her shoes. She wanted to make sure it was ok for her to trim them. She would use her old orthotics to go by.  I returned call and left message that as long as she is comfortable trimming that was fine. She can use her old orthotics or the insoles from her shoes she was putting them in to trim them. I did ask pt to call if she felt she needed an appt.

## 2021-01-24 ENCOUNTER — Ambulatory Visit
Admission: RE | Admit: 2021-01-24 | Discharge: 2021-01-24 | Disposition: A | Discharge status: Home / Self Care | Payer: 59 | Source: Ambulatory Visit | Attending: Rheumatology | Admitting: Rheumatology

## 2021-01-24 DIAGNOSIS — E2839 Other primary ovarian failure: Secondary | ICD-10-CM

## 2021-02-14 ENCOUNTER — Ambulatory Visit: Payer: 59

## 2021-02-14 ENCOUNTER — Other Ambulatory Visit: Payer: Self-pay

## 2021-02-14 DIAGNOSIS — M19072 Primary osteoarthritis, left ankle and foot: Secondary | ICD-10-CM

## 2021-02-14 DIAGNOSIS — M7752 Other enthesopathy of left foot: Secondary | ICD-10-CM

## 2021-02-14 NOTE — Progress Notes (Signed)
SITUATION Reason for Consult: Follow-up with [facility] custom orthotics Patient / Caregiver Report: Patient reports these have been a problem since fitting in November and are coming apart  OBJECTIVE DATA History / Diagnosis:    ICD-10-CM   1. Tendinitis of left foot  M77.52     2. DJD (degenerative joint disease), ankle and foot, left  M19.072       Change in Pathology: None  ACTIONS PERFORMED Patient's equipment was checked for structural stability and fit. Devices are sub-standard compared to previous models. Patient was re-casted and [facility] [employer] replacements are to be made. All questions answered and concerns addressed.  PLAN Patient to be called for return in approximately four weeks for fitting.. Plan of care discussed with and agreed upon by patient / caregiver.

## 2021-03-10 ENCOUNTER — Telehealth: Payer: Self-pay

## 2021-03-10 NOTE — Telephone Encounter (Signed)
LVM FOR PT TO CALL BACK TO SCHEDULE ORTHOTICS PICK UP 

## 2021-03-14 ENCOUNTER — Ambulatory Visit (INDEPENDENT_AMBULATORY_CARE_PROVIDER_SITE_OTHER): Payer: 59

## 2021-03-14 ENCOUNTER — Other Ambulatory Visit: Payer: Self-pay

## 2021-03-14 DIAGNOSIS — M7752 Other enthesopathy of left foot: Secondary | ICD-10-CM | POA: Diagnosis not present

## 2021-03-14 DIAGNOSIS — M7751 Other enthesopathy of right foot: Secondary | ICD-10-CM | POA: Diagnosis not present

## 2021-03-14 DIAGNOSIS — M659 Synovitis and tenosynovitis, unspecified: Secondary | ICD-10-CM

## 2021-03-14 NOTE — Progress Notes (Signed)
SITUATION: ?Reason for Visit: Fitting and Delivery of Custom Fabricated Foot Orthoses ?Patient Report: Patient reports comfort and is satisfied with device. ? ?OBJECTIVE DATA: ?Patient History / Diagnosis:   ?  ICD-10-CM   ?1. Tendinitis of left foot  M77.52   ?  ?2. Synovitis of left ankle  M65.9   ?  ? ? ?Provided Device:  Custom Functional Foot Orthotics ?    [employer]: [id number] ? ?GOAL OF ORTHOSIS ?- Improve gait ?- Decrease energy expenditure ?- Improve Balance ?- Provide Triplanar stability of foot complex ?- Facilitate motion ? ?ACTIONS PERFORMED ?Patient was fit with foot orthotics trimmed to shoe last. Patient tolerated fittign procedure.  ? ?Patient was provided with verbal and written instruction and demonstration regarding donning, doffing, wear, care, proper fit, function, purpose, cleaning, and use of the orthosis and in all related precautions and risks and benefits regarding the orthosis. ? ?Patient was also provided with verbal instruction regarding how to report any failures or malfunctions of the orthosis and necessary follow up care. Patient was also instructed to contact our office regarding any change in status that may affect the function of the orthosis. ? ?Patient demonstrated independence with proper donning, doffing, and fit and verbalized understanding of all instructions. ? ?PLAN: ?Patient is to follow up in one week or as necessary (PRN). All questions were answered and concerns addressed. Plan of care was discussed with and agreed upon by the patient. ? ?

## 2021-03-31 ENCOUNTER — Telehealth: Payer: Self-pay

## 2021-03-31 NOTE — Telephone Encounter (Signed)
Foot Orthotics sent to central fabrication for refurbishment ?

## 2021-04-29 ENCOUNTER — Telehealth: Payer: Self-pay | Admitting: Podiatry

## 2021-04-29 NOTE — Telephone Encounter (Signed)
Left vm that orthotics are ready for pu, no balance ?

## 2021-05-22 ENCOUNTER — Encounter: Payer: Self-pay | Admitting: Podiatry

## 2021-08-22 ENCOUNTER — Ambulatory Visit: Payer: 59 | Admitting: Podiatry

## 2021-08-22 DIAGNOSIS — M7752 Other enthesopathy of left foot: Secondary | ICD-10-CM | POA: Diagnosis not present

## 2021-08-22 MED ORDER — BETAMETHASONE SOD PHOS & ACET 6 (3-3) MG/ML IJ SUSP
3.0000 mg | Freq: Once | INTRAMUSCULAR | Status: AC
  Administered 2021-08-22: 3 mg via INTRA_ARTICULAR

## 2021-08-22 NOTE — Progress Notes (Signed)
   Chief Complaint  Patient presents with   Ankle Pain    Left foot ankle pain    HPI: 61 y.o. female presenting today for a new complaint regarding left lateral ankle pain is been going on for several months.  Patient states that she wears her current orthotics but by the end of the day she has pain to the inside of her foot and outside of her ankle.  She denies a history of trauma.  She has not done anything for treatment.  She presents for further treatment evaluation   No past medical history on file. No past surgical history on file. Allergies  Allergen Reactions   Compazine [Prochlorperazine Edisylate] Nausea Only   Tylox [Oxycodone-Acetaminophen] Itching      Physical Exam: General: The patient is alert and oriented x3 in no acute distress.  Dermatology: Skin and integument is intact and WNL.  No open wounds or lesions.  Vascular: Palpable pedal pulses bilaterally. No edema or erythema noted. Capillary refill within normal limits.  Neurological: Epicritic and protective threshold grossly intact bilaterally.   Musculoskeletal Exam: There continues to be some pain on palpation to the lateral aspect of the left ankle joint.  Range of motion WNL.  Muscle strength 5/5 all compartments  Assessment: 1. S/p PT tendon repair with navicular exostectomy left. DOS: 07/08/2017 2.  Synovitis/capsulitis of left ankle lateral aspect   Plan of Care:  1. Patient evaluated.  2. Injection of 0.5 mLs Celestone Soluspan injected into the left ankle joint  3.  Continue custom molded orthotics 4.  Patient has an ankle brace at home.  Wear daily 5.  Return to clinic in 8 weeks.  Follow-up x-rays next visit.  If there is no significant improvement we may need to proceed with MRI LT ankle.      Felecia Shelling, DPM [facility]  Dr. Felecia Shelling, DPM    [address] [place], [place] [address]                Office [phone]  Fax  [phone]

## 2021-08-22 NOTE — Progress Notes (Signed)
Dg  

## 2021-09-09 ENCOUNTER — Other Ambulatory Visit: Payer: Self-pay | Admitting: Internal Medicine

## 2021-09-09 DIAGNOSIS — Z1231 Encounter for screening mammogram for malignant neoplasm of breast: Secondary | ICD-10-CM

## 2021-10-22 ENCOUNTER — Ambulatory Visit (INDEPENDENT_AMBULATORY_CARE_PROVIDER_SITE_OTHER): Payer: 59

## 2021-10-22 ENCOUNTER — Ambulatory Visit: Payer: 59 | Admitting: Podiatry

## 2021-10-22 DIAGNOSIS — M7752 Other enthesopathy of left foot: Secondary | ICD-10-CM | POA: Diagnosis not present

## 2021-10-22 NOTE — Progress Notes (Signed)
   Chief Complaint  Patient presents with   Foot Pain    Patient is here for left foot pain.patient states that she is still having some pain.    HPI: 61 y.o. female presenting today for follow up evaluation of left lateral ankle pain that has been going on for several months.  Patient states the injection only helped temporarily.  She continues to have pain and tenderness to the lateral and anterior aspect of the ankle.  Denies a history of injury.  She presents for further treatment and evaluation  No past medical history on file. No past surgical history on file. Allergies  Allergen Reactions   Compazine [Prochlorperazine Edisylate] Nausea Only   Tylox [Oxycodone-Acetaminophen] Itching      Physical Exam: General: The patient is alert and oriented x3 in no acute distress.  Dermatology: Skin and integument is intact and WNL.  No open wounds or lesions.  Vascular: Palpable pedal pulses bilaterally. No edema or erythema noted. Capillary refill within normal limits.  Neurological: Epicritic and protective threshold grossly intact bilaterally.   Musculoskeletal Exam: There continues to be some pain on palpation to the lateral aspect of the left ankle joint.  Range of motion WNL.  Muscle strength 5/5 all compartments  Assessment: 1. S/p PT tendon repair with navicular exostectomy left. DOS: 07/08/2017 2.  Synovitis/capsulitis of left ankle lateral aspect   Plan of Care:  1. Patient evaluated.  2.  Patient continues to have pain and tenderness to the ankle despite conservative treatments including steroid injections, oral anti-inflammatories, ankle bracing, and orthotics. 3.  MRI ordered LT ankle 4.  In the meantime continue good supportive shoes and sneakers and ankle brace as needed 5.  Will contact patient via telephone after MRI results to discuss findings and further treatment options     Edrick Kins, DPM [facility]  Dr. Edrick Kins, DPM    [address]  [place],  [address]                Office [phone]  Fax [phone]

## 2021-10-24 ENCOUNTER — Ambulatory Visit: Payer: 59 | Admitting: Podiatry

## 2021-10-27 ENCOUNTER — Ambulatory Visit
Admission: RE | Admit: 2021-10-27 | Discharge: 2021-10-27 | Disposition: A | Discharge status: Home / Self Care | Payer: 59 | Source: Ambulatory Visit | Attending: Internal Medicine | Admitting: Internal Medicine

## 2021-10-27 DIAGNOSIS — Z1231 Encounter for screening mammogram for malignant neoplasm of breast: Secondary | ICD-10-CM

## 2021-11-06 ENCOUNTER — Ambulatory Visit
Admission: RE | Admit: 2021-11-06 | Discharge: 2021-11-06 | Disposition: A | Discharge status: Home / Self Care | Payer: 59 | Source: Ambulatory Visit | Attending: Podiatry | Admitting: Podiatry

## 2021-11-06 DIAGNOSIS — M7752 Other enthesopathy of left foot: Secondary | ICD-10-CM

## 2021-11-12 NOTE — Progress Notes (Signed)
Spoke with patient over the phone and discussed the MRI findings.  Unfortunately no significant findings to the lateral aspect of the ankle.  Continue anti-inflammatory, bracing, and compression with good shoes.  Return to clinic as needed.-Dr. Amalia Hailey

## 2021-12-07 ENCOUNTER — Ambulatory Visit
Admission: EM | Admit: 2021-12-07 | Discharge: 2021-12-07 | Disposition: A | Discharge status: Home / Self Care | Payer: 59 | Attending: Nurse Practitioner | Admitting: Nurse Practitioner

## 2021-12-07 ENCOUNTER — Ambulatory Visit (INDEPENDENT_AMBULATORY_CARE_PROVIDER_SITE_OTHER): Payer: 59

## 2021-12-07 DIAGNOSIS — R062 Wheezing: Secondary | ICD-10-CM | POA: Diagnosis not present

## 2021-12-07 DIAGNOSIS — J069 Acute upper respiratory infection, unspecified: Secondary | ICD-10-CM | POA: Diagnosis not present

## 2021-12-07 DIAGNOSIS — R059 Cough, unspecified: Secondary | ICD-10-CM | POA: Diagnosis not present

## 2021-12-07 DIAGNOSIS — R0602 Shortness of breath: Secondary | ICD-10-CM

## 2021-12-07 MED ORDER — AZITHROMYCIN 250 MG PO TABS: 250.0000 mg | ORAL_TABLET | Freq: Every day | ORAL | 0 refills | Status: DC

## 2021-12-07 MED ORDER — ALBUTEROL SULFATE HFA 108 (90 BASE) MCG/ACT IN AERS: 2.0000 | INHALATION_SPRAY | Freq: Four times a day (QID) | RESPIRATORY_TRACT | 0 refills | Status: DC | PRN

## 2021-12-07 MED ORDER — BENZONATATE 100 MG PO CAPS: 100.0000 mg | ORAL_CAPSULE | Freq: Three times a day (TID) | ORAL | 0 refills | Status: DC | PRN

## 2021-12-07 MED ORDER — PSEUDOEPH-BROMPHEN-DM 30-2-10 MG/5ML PO SYRP: 5.0000 mL | ORAL_SOLUTION | Freq: Four times a day (QID) | ORAL | 0 refills | Status: AC | PRN

## 2021-12-07 NOTE — ED Triage Notes (Signed)
Pt reports cough and sore throat due to coughing  x 5 days. OTC meds gives no relief.

## 2021-12-07 NOTE — Discharge Instructions (Addendum)
The x-rays do not show pneumonia. Take medication as prescribed. Increase fluids and allow for plenty of rest. Recommend using a humidifier in the bedroom at nighttime during sleep to help with cough and nasal congestion.  Also recommend sleeping elevated on pillows while symptoms persist. If your symptoms continue to persist after this treatment, please follow-up with your primary care physician for further evaluation. Go to the emergency department if you experience worsening shortness of breath, trouble breathing, difficulty breathing, or inability to speak in a complete sentence. Follow-up as needed.

## 2021-12-07 NOTE — ED Provider Notes (Signed)
[facility]    CSN: [id number] Arrival date & time: 12/07/21  0803      History   Chief Complaint Chief Complaint  Patient presents with   Cough   Sore Throat    HPI Laura Tapia is a 61 y.o. female.   The history is provided by the patient.   Patient reports with a 5-day history of cough.  Patient states that the cough is worse at night and worse in the morning.  She states cough is nonproductive at this time.  She denies fever, chills, headache, ear pain, wheezing, shortness of breath, or difficulty breathing.  She does endorse ear fullness and pressure along with throat irritation due to the coughing.  She states that when she does take in a deep breath she can feel something in her chest.  She states the cough has kept her from sleeping.  She has been taking over-the-counter cough medicines with minimal relief.  She also states that she has been sleeping in her recliner over the last several days. History reviewed. No pertinent past medical history.  Patient Active Problem List   Diagnosis Date Noted   Scoliosis deformity of spine 04/18/2018   Degeneration of lumbar intervertebral disc 04/12/2018   Pain of lumbar spine 03/22/2018   Postmenopausal bleeding 08/04/2017   Overweight 08/04/2017   Nausea 08/04/2017   Menopausal syndrome 08/04/2017   Lumbar spondylosis 08/04/2017   Hypothyroidism 08/04/2017   Hypercholesterolemia 08/04/2017   Herpes simplex 08/04/2017   Headache 08/04/2017   Gastroesophageal reflux disease 08/04/2017   Cystitis 08/04/2017   Chronic constipation 08/04/2017   Allergic rhinitis 08/04/2017   Pain of left hand 06/11/2017   Carpal tunnel syndrome of left wrist 06/11/2017   Knee pain 01/13/1991    History reviewed. No pertinent surgical history.  OB History   No obstetric history on file.      Home Medications    Prior to Admission medications   Medication Sig Start Date End Date Taking? Authorizing Provider  albuterol  (VENTOLIN HFA) 108 (90 Base) MCG/ACT inhaler Inhale 2 puffs into the lungs every 6 (six) hours as needed for wheezing or shortness of breath. 12/07/21  Yes Tejas Seawood-Warren, Alda Lea, NP  azithromycin (ZITHROMAX) 250 MG tablet Take 1 tablet (250 mg total) by mouth daily. Take first 2 tablets together, then 1 every day until finished. 12/07/21  Yes Koron Godeaux-Warren, Alda Lea, NP  brompheniramine-pseudoephedrine-DM 30-2-10 MG/5ML syrup Take 5 mLs by mouth 4 (four) times daily as needed. 12/07/21  Yes Mairen Wallenstein-Warren, Alda Lea, NP  Adalimumab (HUMIRA PEN) 40 MG/0.4ML PNKT Humira(CF) Pen 40 mg/0.4 mL subcutaneous kit    [provider]  atenolol (TENORMIN) 50 MG tablet Take 50 mg by mouth daily.    [provider]  atorvastatin (LIPITOR) 20 MG tablet atorvastatin 20 mg tablet    [provider]  butalbital-acetaminophen-caffeine (FIORICET, ESGIC) 50-325-40 MG tablet butalbital-acetaminophen-caffeine 50 mg-325 mg-40 mg tablet  TAKE 1 TO 2 TABLETS ORALLY EVERY 4 TO 6 HOURS AS NEEDED FOR PAIN.    [provider]  calcium carbonate (OS-CAL) 600 MG TABS tablet Take 600 mg by mouth 2 (two) times daily with a meal.    [provider]  cetirizine (ZYRTEC) 10 MG tablet Take 10 mg by mouth daily.    [provider]  chlorpheniramine-HYDROcodone (TUSSIONEX) 10-8 MG/5ML SUER hydrocodone 10 mg-chlorpheniramine 8 mg/5 mL oral susp extend.rel 12hr    [provider]  cholecalciferol (VITAMIN D) 1000 UNITS tablet Take 1,000  Units by mouth daily.    [provider]  colestipol (COLESTID) 1 g tablet Take 1-2 g by mouth daily. 11/11/18   [provider]  cyclobenzaprine (FLEXERIL) 10 MG tablet cyclobenzaprine 10 mg tablet    [provider]  diltiazem (CARDIZEM CD) 180 MG 24 hr capsule  08/23/17   [provider]  diltiazem (DILACOR XR) 120 MG 24 hr capsule Take 120 mg by mouth daily.    [provider]  diltiazem [employer])  180 MG 24 hr capsule  01/21/17   [provider]  divalproex (DEPAKOTE SPRINKLE) 125 MG capsule divalproex 125 mg capsule,delayed release sprinkle    [provider]  DULoxetine (CYMBALTA) 60 MG capsule  09/29/18   [provider]  escitalopram (LEXAPRO) 10 MG tablet  08/23/17   [provider]  Estradiol (YUVAFEM) 10 MCG TABS vaginal tablet Yuvafem 10 mcg vaginal tablet 11/06/16   [provider]  estradiol-norethindrone (ACTIVELLA) 1-0.5 MG per tablet Take 1 tablet by mouth daily.    [provider]  fluconazole (DIFLUCAN) 150 MG tablet  08/23/17   [provider]  fluticasone (FLONASE) 50 MCG/ACT nasal spray fluticasone propionate 50 mcg/actuation nasal spray,suspension    [provider]  gabapentin (NEURONTIN) 100 MG capsule Take 100 mg by mouth 2 (two) times daily.    [provider]  HYDROcodone-acetaminophen (NORCO) 10-325 MG tablet Take 1 tablet by mouth 2 (two) times daily as needed. 04/04/19   [provider]  hydroxychloroquine (PLAQUENIL) 200 MG tablet Take by mouth daily.    [provider]  ketorolac (TORADOL) 10 MG tablet ketorolac 10 mg tablet    [provider]  levofloxacin (LEVAQUIN) 500 MG tablet levofloxacin 500 mg tablet    [provider]  levothyroxine (SYNTHROID, LEVOTHROID) 100 MCG tablet Take 100 mcg by mouth daily before breakfast.    [provider]  losartan-hydrochlorothiazide (HYZAAR) 50-12.5 MG tablet Take 1 tablet by mouth daily. 04/07/19   [provider]  meloxicam (MOBIC) 15 MG tablet  01/01/17   [provider]  methylPREDNISolone (MEDROL DOSEPAK) 4 MG TBPK tablet 6 day dose pack - take as directed 05/03/19   Edrick Kins, DPM  metoprolol succinate (TOPROL-XL) 50 MG 24 hr tablet metoprolol succinate ER 50 mg tablet,extended release 24 hr    [provider]  metroNIDAZOLE (FLAGYL) 500 MG tablet Take 250 mg by mouth  3 (three) times daily. 11/18/18   [provider]  MULTIPLE VITAMIN PO Take by mouth.    [provider]  nystatin (MYCOSTATIN) 100000 UNIT/ML suspension nystatin 100,000 unit/mL oral suspension    [provider]  omeprazole (PRILOSEC) 40 MG capsule Take 40 mg by mouth daily.    [provider]  phentermine (ADIPEX-P) 37.5 MG tablet Adipex-P 37.5 mg tablet  Take 1 tablet every day by oral route in the morning.    [provider]  predniSONE (DELTASONE) 20 MG tablet prednisone 20 mg tablet    [provider]  promethazine (PHENERGAN) 25 MG tablet promethazine 25 mg tablet    [provider]  terbinafine (LAMISIL) 250 MG tablet Take one tablet once daily x 7 days, then repeat every month x 4 months 07/06/14   Wallene Huh, DPM  topiramate (TOPAMAX) 100 MG tablet Take 100 mg by mouth 2 (two) times daily.    [provider]  valACYclovir (VALTREX) 500 MG tablet valacyclovir 500 mg tablet  TAKE (1) TABLET BY MOUTH ONCE DAILY.  [provider]  zolmitriptan (ZOMIG) 5 MG tablet Take 5 mg by mouth as needed for migraine.    [provider]  zolpidem (AMBIEN) 10 MG tablet zolpidem 10 mg tablet 01/01/17   [provider]    Family History Family History  Problem Relation Age of Onset   Breast cancer Neg Hx     Social History Social History   Tobacco Use   Smoking status: Never   Smokeless tobacco: Never  Substance Use Topics   Alcohol use: Not Currently     Allergies   Compazine [prochlorperazine edisylate], Pregabalin, Tylox [oxycodone-acetaminophen], and Tyloxapol   Review of Systems Review of Systems Per HPI  Physical Exam Triage Vital Signs ED Triage Vitals  Enc Vitals Group     BP 12/07/21 0809 (!) 145/89     Pulse Rate 12/07/21 0809 90     Resp 12/07/21 0809 16     Temp 12/07/21 0809 97.8 F (36.6 C)     Temp Source 12/07/21 0809 Oral     SpO2 12/07/21 0809 98 %      Weight --      Height --      Head Circumference --      Peak Flow --      Pain Score 12/07/21 0813 0     Pain Loc --      Pain Edu? --      Excl. in [place]? --    No data found.  Updated Vital Signs BP (!) 145/89 (BP Location: Right Arm)   Pulse 90   Temp 97.8 F (36.6 C) (Oral)   Resp 16   SpO2 98%   Visual Acuity Right Eye Distance:   Left Eye Distance:   Bilateral Distance:    Right Eye Near:   Left Eye Near:    Bilateral Near:     Physical Exam Vitals and nursing note reviewed.  Constitutional:      General: She is not in acute distress.    Appearance: She is well-developed.  HENT:     Head: Normocephalic.     Right Ear: Tympanic membrane and ear canal normal.     Left Ear: Tympanic membrane and ear canal normal.     Nose: No congestion or rhinorrhea.     Mouth/Throat:     Pharynx: Uvula midline. Posterior oropharyngeal erythema present. No pharyngeal swelling or oropharyngeal exudate.  Eyes:     Conjunctiva/sclera: Conjunctivae normal.     Pupils: Pupils are equal, round, and reactive to light.  Cardiovascular:     Rate and Rhythm: Normal rate and regular rhythm.     Heart sounds: Normal heart sounds.  Pulmonary:     Effort: Pulmonary effort is normal.     Breath sounds: Rhonchi (posterior LUL, LLL) present. Wheezes: expiratory wheezing noted in anterior LUL. Abdominal:     General: Bowel sounds are normal.     Palpations: Abdomen is soft.     Tenderness: There is no abdominal tenderness.  Musculoskeletal:     Cervical back: Normal range of motion.  Lymphadenopathy:     Cervical: No cervical adenopathy.  Skin:    General: Skin is warm and dry.  Neurological:     General: No focal deficit present.     Mental Status: She is alert and oriented to person, place, and time.  Psychiatric:        Mood and Affect: Mood normal.        Behavior: Behavior normal.  UC Treatments / Results  Labs (all labs ordered are listed, but only abnormal results are  displayed) Labs Reviewed - No data to display  EKG   Radiology DG Chest 2 View  Result Date: 12/07/2021 CLINICAL DATA:  61 year old female with wheezing, cough, shortness of breath for 5 days. EXAM: CHEST - 2 VIEW COMPARISON:  Chest radiographs 04/13/2011. FINDINGS: PA and lateral views at 0837 hours. Large lung volumes, mildly progressed from 2013. Normal cardiac size and mediastinal contours. Visualized tracheal air column is within normal limits. No pneumothorax, pulmonary edema, pleural effusion, or confluent pulmonary opacity. No acute osseous abnormality identified. Negative visible bowel gas. IMPRESSION: Suspicion of chronic pulmonary hyperinflation. No acute cardiopulmonary abnormality. Electronically Signed   By: Genevie Ann M.D.   On: 12/07/2021 09:07    Procedures Procedures (including critical care time)  Medications Ordered in UC Medications - No data to display  Initial Impression / Assessment and Plan / UC Course  I have reviewed the triage vital signs and the nursing notes.  Pertinent labs & imaging results that were available during my care of the patient were reviewed by me and considered in my medical decision making (see chart for details).  Patient presents for complaints of cough that is been present for the past 5 days.  On exam, patient's vital signs are stable, although she is mildly hypertensive, she is in no acute distress.  On exam, patient had rhonchi and wheezing.  Some of the rhonchi did clear with cough.  Chest x-ray was negative for pneumonia did show chronic pulmonary hyperinflation.  Symptoms consistent with upper respiratory infection, cannot rule out bacterial infection, based on the patient's lung sounds.  Will treat patient with azithromycin 250 mg at this time along with an albuterol inhaler and benzonatate 100 mg.  Supportive care recommendations were provided to the patient.  Patient verbalizes understanding.  All questions were answered.  Patient was  given strict ER precautions.  Patient is stable for discharge. Final Clinical Impressions(s) / UC Diagnoses   Final diagnoses:  Acute upper respiratory infection     Discharge Instructions      The x-rays do not show pneumonia. Take medication as prescribed. Increase fluids and allow for plenty of rest. Recommend using a humidifier in the bedroom at nighttime during sleep to help with cough and nasal congestion.  Also recommend sleeping elevated on pillows while symptoms persist. If your symptoms continue to persist after this treatment, please follow-up with your primary care physician for further evaluation. Go to the emergency department if you experience worsening shortness of breath, trouble breathing, difficulty breathing, or inability to speak in a complete sentence. Follow-up as needed.     ED Prescriptions     Medication Sig Dispense Auth. Provider   azithromycin (ZITHROMAX) 250 MG tablet Take 1 tablet (250 mg total) by mouth daily. Take first 2 tablets together, then 1 every day until finished. 6 tablet Kadeshia Kasparian-Warren, Alda Lea, NP   benzonatate (TESSALON PERLES) 100 MG capsule  (Status: Discontinued) Take 1 capsule (100 mg total) by mouth 3 (three) times daily as needed for cough. 30 capsule Nancyann Cotterman-Warren, Alda Lea, NP   albuterol (VENTOLIN HFA) 108 (90 Base) MCG/ACT inhaler Inhale 2 puffs into the lungs every 6 (six) hours as needed for wheezing or shortness of breath. 8 g Azzie Thiem-Warren, Alda Lea, NP   brompheniramine-pseudoephedrine-DM 30-2-10 MG/5ML syrup Take 5 mLs by mouth 4 (four) times daily as needed. 140 mL Cleda Imel-Warren, Alda Lea, NP  PDMP not reviewed this encounter.   Tish Men, NP 12/07/21 [phone]

## 2022-01-07 ENCOUNTER — Ambulatory Visit: Payer: 59 | Admitting: Podiatry

## 2022-01-07 ENCOUNTER — Encounter: Payer: Self-pay | Admitting: Podiatry

## 2022-01-07 VITALS — BP 136/66 | HR 70

## 2022-01-07 DIAGNOSIS — L600 Ingrowing nail: Secondary | ICD-10-CM | POA: Diagnosis not present

## 2022-01-07 DIAGNOSIS — R52 Pain, unspecified: Secondary | ICD-10-CM | POA: Diagnosis not present

## 2022-01-07 NOTE — Progress Notes (Signed)
   Chief Complaint  Patient presents with   Ingrown Toenail    Patient is here for bilateral ingrown toe nail 2nd toe.    Subjective: Patient presents today for evaluation of pain to the medial border of the bilateral second digits. Patient is concerned for possible ingrown nail.  It is very sensitive to touch.  Patient presents today for further treatment and evaluation.  No past medical history on file.  Objective:  General: Well developed, nourished, in no acute distress, alert and oriented x3   Dermatology: Skin is warm, dry and supple bilateral.  Medial border bilateral second digits is tender with evidence of an ingrowing nail. Pain on palpation noted to the border of the nail fold. The remaining nails appear unremarkable at this time. There are no open sores, lesions.  Vascular: DP and PT pulses palpable.  No clinical evidence of vascular compromise  Neruologic: Grossly intact via light touch bilateral.  Musculoskeletal: No pedal deformity noted  Assesement: #1 Paronychia with ingrowing nail border bilateral second digits  Plan of Care:  1. Patient evaluated.  2. Discussed treatment alternatives and plan of care. Explained nail avulsion procedure and post procedure course to patient. 3. Patient opted for permanent partial nail avulsion of the ingrown portion of the nail.  4. Prior to procedure, local anesthesia infiltration utilized using 3 ml of a 50:50 mixture of 2% plain lidocaine and 0.5% plain marcaine in a normal hallux block fashion and a betadine prep performed.  5. Partial permanent nail avulsion with chemical matrixectomy performed using 3x30sec applications of phenol followed by alcohol flush.  6. Light dressing applied.  Post care instructions provided 7.  Return to clinic 2 weeks.  Felecia Shelling, DPM [facility]  Dr. Felecia Shelling, DPM    [address] [place], [place] [address]                Office  [phone]  Fax [phone]

## 2022-01-07 NOTE — Patient Instructions (Signed)

## 2022-01-28 ENCOUNTER — Ambulatory Visit: Payer: 59 | Admitting: Podiatry

## 2022-01-28 VITALS — BP 153/76 | HR 60

## 2022-01-28 DIAGNOSIS — L600 Ingrowing nail: Secondary | ICD-10-CM | POA: Diagnosis not present

## 2022-01-28 NOTE — Progress Notes (Signed)
   Chief Complaint  Patient presents with   Ingrown Toenail    Bilateral ingrown toenail follow-up, 2nd toes, patient states her toe are still sore,     Subjective: 62 y.o. female presents today status post permanent nail avulsion procedure of the medial border of the bilateral second digits that was performed on 01/07/2022.  Patient doing well.  She continues to have some very slight tenderness to the toes but overall improvement.   No past medical history on file.  Objective: Neurovascular status intact.  Skin is warm, dry and supple. Nail and respective nail fold appears to be healing appropriately.   Assessment: #1 s/p partial permanent nail matrixectomy medial border bilateral second digits   Plan of care: #1 patient was evaluated  #2 light debridement of the periungual debris was performed to the border of the respective toe and nail plate using a tissue nipper. #3 patient is to return to clinic on a PRN basis.   Edrick Kins, DPM [facility]  Dr. Edrick Kins, DPM    [address] [place], [place] [address]                Office [phone]  Fax [phone]

## 2022-06-19 ENCOUNTER — Other Ambulatory Visit: Payer: Self-pay | Admitting: Internal Medicine

## 2022-06-19 DIAGNOSIS — Z1231 Encounter for screening mammogram for malignant neoplasm of breast: Secondary | ICD-10-CM

## 2022-08-11 ENCOUNTER — Other Ambulatory Visit: Payer: Self-pay | Admitting: Obstetrics & Gynecology

## 2022-08-11 DIAGNOSIS — N63 Unspecified lump in unspecified breast: Secondary | ICD-10-CM

## 2022-08-20 ENCOUNTER — Other Ambulatory Visit: Payer: 59

## 2022-08-21 ENCOUNTER — Other Ambulatory Visit: Payer: Self-pay | Admitting: Gastroenterology

## 2022-08-21 DIAGNOSIS — R1311 Dysphagia, oral phase: Secondary | ICD-10-CM

## 2022-09-01 ENCOUNTER — Other Ambulatory Visit: Payer: 59

## 2022-09-03 ENCOUNTER — Ambulatory Visit
Admission: RE | Admit: 2022-09-03 | Discharge: 2022-09-03 | Disposition: A | Discharge status: Home / Self Care | Payer: 59 | Source: Ambulatory Visit | Attending: Obstetrics & Gynecology | Admitting: Obstetrics & Gynecology

## 2022-09-03 ENCOUNTER — Ambulatory Visit
Admission: RE | Admit: 2022-09-03 | Discharge: 2022-09-03 | Disposition: A | Discharge status: Home / Self Care | Payer: 59 | Source: Ambulatory Visit | Attending: Gastroenterology | Admitting: Gastroenterology

## 2022-09-03 DIAGNOSIS — R1311 Dysphagia, oral phase: Secondary | ICD-10-CM

## 2022-09-03 DIAGNOSIS — N63 Unspecified lump in unspecified breast: Secondary | ICD-10-CM

## 2022-10-30 ENCOUNTER — Ambulatory Visit: Payer: 59

## 2022-12-25 ENCOUNTER — Ambulatory Visit
Admission: RE | Admit: 2022-12-25 | Discharge: 2022-12-25 | Disposition: A | Discharge status: Home / Self Care | Payer: 59 | Source: Ambulatory Visit | Attending: Internal Medicine | Admitting: Internal Medicine

## 2022-12-25 DIAGNOSIS — Z1231 Encounter for screening mammogram for malignant neoplasm of breast: Secondary | ICD-10-CM

## 2023-02-09 ENCOUNTER — Ambulatory Visit (HOSPITAL_COMMUNITY): Payer: 59 | Attending: Otolaryngology | Admitting: Speech Pathology

## 2023-02-09 ENCOUNTER — Encounter (HOSPITAL_COMMUNITY): Payer: Self-pay | Admitting: Speech Pathology

## 2023-02-09 DIAGNOSIS — R1312 Dysphagia, oropharyngeal phase: Secondary | ICD-10-CM | POA: Insufficient documentation

## 2023-02-09 NOTE — Therapy (Signed)
OUTPATIENT SPEECH LANGUAGE PATHOLOGY SWALLOW EVALUATION   Patient Name: Laura Tapia MRN: [id number] DOB:01/01/1961, 63 y.o., female Today's Date: 02/09/2023  PCP: Hillard Danker REFERRING PROVIDER: Debroah Loop, DO  END OF SESSION:  End of Session - 02/09/23 1638     Visit Number 1    Number of Visits 3    Date for SLP Re-Evaluation 03/11/23    Authorization Type [employer]    SLP Start Time 1515    SLP Stop Time  1600    SLP Time Calculation (min) 45 min    Activity Tolerance Patient tolerated treatment well             History reviewed. No pertinent past medical history. History reviewed. No pertinent surgical history. Patient Active Problem List   Diagnosis Date Noted   Scoliosis deformity of spine 04/18/2018   Degeneration of lumbar intervertebral disc 04/12/2018   Pain of lumbar spine 03/22/2018   Postmenopausal bleeding 08/04/2017   Overweight 08/04/2017   Nausea 08/04/2017   Menopausal syndrome 08/04/2017   Lumbar spondylosis 08/04/2017   Hypothyroidism 08/04/2017   Hypercholesterolemia 08/04/2017   Herpes simplex 08/04/2017   Headache 08/04/2017   Gastroesophageal reflux disease 08/04/2017   Cystitis 08/04/2017   Chronic constipation 08/04/2017   Allergic rhinitis 08/04/2017   Pain of left hand 06/11/2017   Carpal tunnel syndrome of left wrist 06/11/2017   Knee pain 01/13/1991    ONSET DATE: 12/23/2022   REFERRING DIAG: R13.10 (ICD-10-CM) - Dysphagia 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 Rubye Oaks and she wanted me to get swallowing therapy." Pt accompanied by: self  PERTINENT HISTORY: Laura Tapia is a 63 yo female who was referred by Dr. Maryfrances Bunnell for dysphagia treatment following recent Barium Swallow (09/03/22), EGD (09/14/22), and FEES (12/23/22). Per Dr. Rubye Oaks: Patient  reports pills, mainly little ones/sometimes larger ones, stick on the left-side of her throat. Experiences pain only when larger pills stick. Swallows multiple times, drinks, or uses finger to manually move stuck pills to the right side. Denies wrong pipe, chest sticking, or regurgitation. Endorses past esophageal dilations with mild temporary improvement. Reports coughing/choking 2/2 "tickle" sensation. Has woken up to this or can occur while talking and feels she "breathes in wrong". Sensation resolves with coughing and multiple drinks. Sticking and coughing/choking onset 4-11yrs ago and occurs a couple times weekly. She reports a sensation of pills becoming lodged on the left side of her throat. She had an outside esophagram which reportedly has a Zenker diverticulum, but on my review, I do not see a Zenker diverticulum. She has had a dilation of her esophagus in the past, 4 to 5 years ago that had mild improvement, but was not long lasting. An EGD indicated a probable Zenker's diverticulum, but the esophagram did not confirm this finding. She does not experience difficulty swallowing liquids or food, only pills. She also reports a tickling sensation in her throat, which has been present for 4 to 5 years and occurs a few times weekly. This sensation occasionally disrupts her sleep, causing her to wake up coughing and choking. She is a retired Lawyer.  She has a history of reflux and is currently on a regimen of omeprazole 40 mg, which she takes every other day. However, she occasionally requires daily or twice-daily dosing. Despite these medications, her symptoms persist. She supplements this with Zantac on particularly severe  days, but finds it ineffective.  PAIN:  Are you having pain? No  FALLS: Has patient fallen in last 6 months?  No  LIVING ENVIRONMENT: Lives with: lives with their spouse Lives in: House/apartment  PLOF:  Level of assistance: Independent with ADLs, Independent with  IADLs Employment: Retired  PATIENT GOALS: Improve ability to swallow pills  OBJECTIVE:  Note: Objective measures were completed at Evaluation unless otherwise noted. OBJECTIVE:   DIAGNOSTIC FINDINGS:  09/03/2022 Esophagram [facility]): "FINDINGS:  Initial imaging of the larynx demonstrates incomplete clearing of the vallecula and piriform sinus. No significant laryngeal penetration or aspiration is present.   Double contrast imaging demonstrates no mucosal lesions are ulceration.   There is some loss of the primary wave. No significant reflux is present. No residual or recurrent stenosis is present.   A 13 mm barium tablet passes easily.   IMPRESSION:  1. Mild esophageal dysmotility.  2. No residual or recurrent stenosis.  3. No significant laryngeal penetration or aspiration.  4. Incomplete clearing of the vallecula and piriform sinus. "  INSTRUMENTAL SWALLOW STUDY FINDINGS (FEES)   At the request of Dr. Maryfrances Bunnell, a Flexible Endoscopic Evaluation of Swallowing (FEES) was conducted on 12/23/2022.  <<Impression:  Patient presents with mild oropharyngeal dysphagia secondary to age-related changes. Dysphagia is characterized by xerostomia, delayed swallow initiation, reduced bolus propulsion, and incomplete airway closure resulting in prolonged prep/transit time with dry solid, penetration of thin liquids, and trace ranging to moderate vallecular residue. No aspiration observed. Of note, patient reported sticking in throat following successful clearance of pill capsule. Patient is appropriate to continue a regular solid diet with thin liquids and adherence to aspiration precautions below. Patient was educated on risks and adverse outcomes associated with dysphagia/aspiration, impact of thorough oral care-daily and before meals, and rationale for adherence to management recommendations. Encouraged making foods soft/moist, Biotene use before meals, and/or liquid wash as needed to  reduce xerostomia impact. Discussed a course of swallow therapy targeting deficits and, given distance from clinic, offered to send referral to facility closer to home. Patient wishes to discuss further with Dr. Rubye Oaks though would be interested in pursuing at [facility] in [place]. Suspect swallow issues are multifactorial and given known esophageal dysmotility, discussed partial role of esophageal referred sensation in throat symptoms and rationale for adherence to esophageal precautions below. Regarding concern for Zenker's diverticulum, discussed likelihood for additional imaging (via MBS or esophagram-at physician discretion) before proceeding with possible management options. No indication to actively follow for swallowing and will defer instrumental re-evaluation at discretion of Dr. Rubye Oaks. Provided education on signs of oropharyngeal dysphagia and return to clinic criteria. Encouraged patient to reach out if she has any questions or if there is a change in symptoms. Patient and family member verbalized understanding of all exam results and recommendations. Exam results discussed with Dr. Rubye Oaks following the study.  Recommendations: Diet: Regular with Thin Liquids Aspiration precautions:  attention to task, slow rate, take small/single sips, cough/clear throat regularly with liquids, fully swallow before taking next bite/sip  Esophageal precautions: slow rate, upright posture during and 1-2hrs after meals, small bites/sips, thorough mastication of solids, alternate liquids and solids, eat smaller and more frequent meals throughout day Medications: one at a time vs crushed, if permissible, with puree or elixir form, if available>>  Objective swallow impairments: reduced tongue base retraction, reduced laryngeal vestibule closure, and reduced posterior pharyngeal wall contraction Objective recommended compensations: slow rate, small/single sips, cough/clear throat regularly with liquids, fully  swallow  before next bite/sip, esophageal precautions, medications one at a time vs crushed as able  COGNITION: Overall cognitive status: Within functional limits for tasks assessed  SUBJECTIVE DYSPHAGIA REPORTS:  Date of onset: ~August 2024 Reported symptoms: coughing with pills, choking with pills, globus sensation, and xerostomia  Current diet: regular and thin liquids  Co-morbid voice changes: No  FACTORS WHICH MAY INCREASE RISK OF ADVERSE EVENT IN PRESENCE OF ASPIRATION:  General health: well appearing  Risk factors: none evident     ORAL MOTOR EXAMINATION: Overall status: WFL Comments: N/A  CLINICAL SWALLOW ASSESSMENT:   Dentition: adequate natural dentition Vocal quality at baseline: normal Patient directly observed with POs: Yes: thin liquids  Feeding: able to feed self Liquids provided by: cup [facility] Swallow Protocol: Pass Oral phase signs and symptoms:  N/A Pharyngeal phase signs and symptoms:  N/A                                                                                                                            TREATMENT DATE: 02/09/23   PATIENT EDUCATION: Education details: Pt given pharyngeal swallowing exercises to complete at home Person educated: Patient Education method: Explanation, Demonstration, and Handouts Education comprehension: verbalized understanding   ASSESSMENT:  CLINICAL IMPRESSION: Patient is a 63 y.o. female who was seen today for interpretation of recently completed FEES and develop treatment plan for dysphagia following referral from Dr. Maryfrances Bunnell. See details above regarding Barium Swallow (09/03/22), EGD (09/14/22), and FEES (12/23/22). Pt indicates that she has trouble swallowing small pills (stasis on left side and manual finger sweep to clear at times), cornbread, and sometimes gets "choked on air". The following deficits were noted on FEES: reduced tongue base retraction, reduced laryngeal vestibule closure, and reduced  posterior pharyngeal wall constriction. Review of the barium swallow reveals suspected (only one sequence visualized) reduced velar retraction, reduced epiglottic deflection, and reduced tongue base retraction. Pt's oral motor examination is WNL with the exception of xerostomia. Recommend the following pharyngeal swallowing exercises: effortful swallow, Masako, Mendelsohn, and chin tuck against resistance with implementation of swallow. Previous imaging and reports were reviewed with Pt. All exercises were demonstrated and Pt able to implement and return demonstrate. Pt is scheduled for MBSS in April. Plan for swallow therapy 1-2x/month for 2 months. She was encouraged to keep her scheduled MBSS.   OBJECTIVE IMPAIRMENTS: include dysphagia. These impairments are limiting patient from safety when swallowing. Factors affecting potential to achieve goals and functional outcome are previous level of function. Patient will benefit from skilled SLP services to address above impairments and improve overall function.  REHAB POTENTIAL: Excellent   GOALS: Goals reviewed with patient? Yes  SHORT TERM GOALS: Target date: 04/13/2023  Pt will complete pharyngeal swallowing exercises including, but not limited to: masako, effortful swallow, Mendelsohn, lingual press, chin tuck against resistance (CTAR), and laryngeal closure with initial model provided by SLP and daily completion of 3x/day per Pt self report Baseline: min assist, introduced today Goal  status: INITIAL  2.  Pt will independently adhere to aspiration precautions/compensatory strategies for consumption of least restrictive PO to avoid/reduce clinical s/sx aspiration, across 2-3 SLP sessions Baseline: Introduced today Goal status: INITIAL  3.  Pt will keep a "swallow journal" to record any changes and also times needed to manually sweep left side to clear a pill etc. Baseline: Introduced today Goal status: INITIAL  LONG TERM GOALS: Same as short  term goals; This patient will improve oropharyngeal swallowing function to resume the least restrictive diet possible and/or alleviate symptoms of dysphagia.    PLAN:  SLP FREQUENCY:  1-2x/month before MBSS in April  SLP DURATION: other: 2 months  PLANNED INTERVENTIONS: Aspiration precaution training, Pharyngeal strengthening exercises, SLP instruction and feedback, Compensatory strategies, Patient/family education, and [id number] Treatment of swallowing function   Thank you,  Havery Moros, CCC-SLP [phone]  Laura Tapia, CCC-SLP 02/09/2023, 4:40 PM

## 2023-03-01 ENCOUNTER — Encounter (HOSPITAL_COMMUNITY): Payer: Self-pay | Admitting: Speech Pathology

## 2023-03-01 ENCOUNTER — Ambulatory Visit (HOSPITAL_COMMUNITY): Payer: 59 | Attending: Otolaryngology | Admitting: Speech Pathology

## 2023-03-01 DIAGNOSIS — R1312 Dysphagia, oropharyngeal phase: Secondary | ICD-10-CM | POA: Diagnosis present

## 2023-03-01 NOTE — Therapy (Signed)
OUTPATIENT SPEECH LANGUAGE PATHOLOGY SWALLOW TREATMENT   Patient Name: Laura Tapia MRN: [id number] DOB:1960-10-29, 63 y.o., female Today's Date: 03/01/2023  PCP: Hillard Danker REFERRING PROVIDER: Debroah Loop, DO  END OF SESSION:  End of Session - 03/01/23 1200     Visit Number 2    Number of Visits 3    Date for SLP Re-Evaluation 04/13/23   eff: 01/13/23 ded: 257 oop: 9350 coins 20% auth yes [facility] portal   Authorization Type [employer]   eff: 01/13/23 ded: 257 oop: 9350 coins 20% auth yes [facility] portal   SLP Start Time 1015    SLP Stop Time  1100    SLP Time Calculation (min) 45 min    Activity Tolerance Patient tolerated treatment well             History reviewed. No pertinent past medical history. History reviewed. No pertinent surgical history. Patient Active Problem List   Diagnosis Date Noted   Scoliosis deformity of spine 04/18/2018   Degeneration of lumbar intervertebral disc 04/12/2018   Pain of lumbar spine 03/22/2018   Postmenopausal bleeding 08/04/2017   Overweight 08/04/2017   Nausea 08/04/2017   Menopausal syndrome 08/04/2017   Lumbar spondylosis 08/04/2017   Hypothyroidism 08/04/2017   Hypercholesterolemia 08/04/2017   Herpes simplex 08/04/2017   Headache 08/04/2017   Gastroesophageal reflux disease 08/04/2017   Cystitis 08/04/2017   Chronic constipation 08/04/2017   Allergic rhinitis 08/04/2017   Pain of left hand 06/11/2017   Carpal tunnel syndrome of left wrist 06/11/2017   Knee pain 01/13/1991    ONSET DATE: 12/23/2022   REFERRING DIAG: R13.10 (ICD-10-CM) - Dysphagia 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 I do the exercises maybe once a day."  Pt accompanied by: self  PERTINENT HISTORY: Laura Tapia is a 63 yo female who was referred by Dr. Maryfrances Bunnell  for dysphagia treatment following recent Barium Swallow (09/03/22), EGD (09/14/22), and FEES (12/23/22). Per Dr. Rubye Oaks: Patient reports pills, mainly little ones/sometimes larger ones, stick on the left-side of her throat. Experiences pain only when larger pills stick. Swallows multiple times, drinks, or uses finger to manually move stuck pills to the right side. Denies wrong pipe, chest sticking, or regurgitation. Endorses past esophageal dilations with mild temporary improvement. Reports coughing/choking 2/2 "tickle" sensation. Has woken up to this or can occur while talking and feels she "breathes in wrong". Sensation resolves with coughing and multiple drinks. Sticking and coughing/choking onset 4-26yrs ago and occurs a couple times weekly. She reports a sensation of pills becoming lodged on the left side of her throat. She had an outside esophagram which reportedly has a Zenker diverticulum, but on my review, I do not see a Zenker diverticulum. She has had a dilation of her esophagus in the past, 4 to 5 years ago that had mild improvement, but was not long lasting. An EGD indicated a probable Zenker's diverticulum, but the esophagram did not confirm this finding. She does not experience difficulty swallowing liquids or food, only pills. She also reports a tickling sensation in her throat, which has been present for 4 to 5 years and occurs a few times weekly. This sensation occasionally disrupts her sleep, causing her to wake up coughing and choking. She is a retired Lawyer.  She has a history of reflux and is currently on a regimen of omeprazole  40 mg, which she takes every other day. However, she occasionally requires daily or twice-daily dosing. Despite these medications, her symptoms persist. She supplements this with Zantac on particularly severe days, but finds it ineffective.  PAIN:  Are you having pain? No  FALLS: Has patient fallen in last 6 months?  No  LIVING ENVIRONMENT: Lives with: lives with  their spouse Lives in: House/apartment  PLOF:  Level of assistance: Independent with ADLs, Independent with IADLs Employment: Retired  PATIENT GOALS: Improve ability to swallow pills  OBJECTIVE:  Note: Objective measures were completed at Evaluation unless otherwise noted. OBJECTIVE:   DIAGNOSTIC FINDINGS:  09/03/2022 Esophagram [facility]): "FINDINGS:  Initial imaging of the larynx demonstrates incomplete clearing of the vallecula and piriform sinus. No significant laryngeal penetration or aspiration is present.   Double contrast imaging demonstrates no mucosal lesions are ulceration.   There is some loss of the primary wave. No significant reflux is present. No residual or recurrent stenosis is present.   A 13 mm barium tablet passes easily.   IMPRESSION:  1. Mild esophageal dysmotility.  2. No residual or recurrent stenosis.  3. No significant laryngeal penetration or aspiration.  4. Incomplete clearing of the vallecula and piriform sinus. "  INSTRUMENTAL SWALLOW STUDY FINDINGS (FEES)   At the request of Dr. Maryfrances Bunnell, a Flexible Endoscopic Evaluation of Swallowing (FEES) was conducted on 12/23/2022.  <<Impression:  Patient presents with mild oropharyngeal dysphagia secondary to age-related changes. Dysphagia is characterized by xerostomia, delayed swallow initiation, reduced bolus propulsion, and incomplete airway closure resulting in prolonged prep/transit time with dry solid, penetration of thin liquids, and trace ranging to moderate vallecular residue. No aspiration observed. Of note, patient reported sticking in throat following successful clearance of pill capsule. Patient is appropriate to continue a regular solid diet with thin liquids and adherence to aspiration precautions below. Patient was educated on risks and adverse outcomes associated with dysphagia/aspiration, impact of thorough oral care-daily and before meals, and rationale for adherence to management  recommendations. Encouraged making foods soft/moist, Biotene use before meals, and/or liquid wash as needed to reduce xerostomia impact. Discussed a course of swallow therapy targeting deficits and, given distance from clinic, offered to send referral to facility closer to home. Patient wishes to discuss further with Dr. Rubye Oaks though would be interested in pursuing at [facility] in [place]. Suspect swallow issues are multifactorial and given known esophageal dysmotility, discussed partial role of esophageal referred sensation in throat symptoms and rationale for adherence to esophageal precautions below. Regarding concern for Zenker's diverticulum, discussed likelihood for additional imaging (via MBS or esophagram-at physician discretion) before proceeding with possible management options. No indication to actively follow for swallowing and will defer instrumental re-evaluation at discretion of Dr. Rubye Oaks. Provided education on signs of oropharyngeal dysphagia and return to clinic criteria. Encouraged patient to reach out if she has any questions or if there is a change in symptoms. Patient and family member verbalized understanding of all exam results and recommendations. Exam results discussed with Dr. Rubye Oaks following the study.  Recommendations: Diet: Regular with Thin Liquids Aspiration precautions:  attention to task, slow rate, take small/single sips, cough/clear throat regularly with liquids, fully swallow before taking next bite/sip  Esophageal precautions: slow rate, upright posture during and 1-2hrs after meals, small bites/sips, thorough mastication of solids, alternate liquids and solids, eat smaller and more frequent meals throughout day Medications: one at a time vs crushed, if permissible, with puree or elixir form, if available>>  Objective swallow  impairments: reduced tongue base retraction, reduced laryngeal vestibule closure, and reduced posterior pharyngeal wall  contraction Objective recommended compensations: slow rate, small/single sips, cough/clear throat regularly with liquids, fully swallow before next bite/sip, esophageal precautions, medications one at a time vs crushed as able  COGNITION: Overall cognitive status: Within functional limits for tasks assessed  SUBJECTIVE DYSPHAGIA REPORTS:  Date of onset: ~August 2024 Reported symptoms: coughing with pills, choking with pills, globus sensation, and xerostomia  Current diet: regular and thin liquids  Co-morbid voice changes: No  FACTORS WHICH MAY INCREASE RISK OF ADVERSE EVENT IN PRESENCE OF ASPIRATION:  General health: well appearing  Risk factors: none evident     ORAL MOTOR EXAMINATION: Overall status: WFL Comments: N/A  CLINICAL SWALLOW ASSESSMENT:   Dentition: adequate natural dentition Vocal quality at baseline: normal Patient directly observed with POs: Yes: thin liquids  Feeding: able to feed self Liquids provided by: cup [facility] Swallow Protocol: Pass Oral phase signs and symptoms:  N/A Pharyngeal phase signs and symptoms:  N/A                                                                                                                            TREATMENT DATE: 03/01/23 Pt reports implementing the following swallowing exercises: Effortful swallow with lingual press, Masako, Mendelsohn, and modified chin tuck against resistance with swallow (TJMD) and was able to demonstrate completion for SLP today. She indicates that she is only completing about once per day and she was encouraged to complete 3x/day and to really focus on this over the next month. She has not had any recurrence of needing to manually dislodge food/pill on her left side since when she was seen a few weeks ago. She ws encouraged to keep track of swallow dysfunction occurrences on the paper given. SLP reviewed imaging from barium swallow and pointed out what appears to be reduced velar retraction during the  swallow, however difficult to fully assess on the limited imaging. She is scheduled for her MBSS on April 17 and plans to definitely keep that appointment. Pt consumed 3oz water [employer]) in session and passed. She continues to report xerostomia and usually has sugar free lemon discs to alleviate. She was encouraged to look at [facility] and Biotene products as well. She avoids cold liquids as she has dental sensitivity (she also tends to use a straw). Pt is able to complete all of the swallowing exercises independently with use of written cues as needed. She should continue with these 3x/day and will return to the clinic in ~4 weeks.    PATIENT EDUCATION: Education details: Pt given pharyngeal swallowing exercises to complete at home Person educated: Patient Education method: Explanation, Demonstration, and Handouts Education comprehension: verbalized understanding   ASSESSMENT:  CLINICAL IMPRESSION: (from the evaluation on 02/09/2023) Patient is a 63 y.o. female who was seen today for interpretation of recently completed FEES and develop treatment plan for dysphagia following referral from Dr. Maryfrances Bunnell. See details above  regarding Barium Swallow (09/03/22), EGD (09/14/22), and FEES (12/23/22). Pt indicates that she has trouble swallowing small pills (stasis on left side and manual finger sweep to clear at times), cornbread, and sometimes gets "choked on air". The following deficits were noted on FEES: reduced tongue base retraction, reduced laryngeal vestibule closure, and reduced posterior pharyngeal wall constriction. Review of the barium swallow reveals suspected (only one sequence visualized) reduced velar retraction, reduced epiglottic deflection, and reduced tongue base retraction. Pt's oral motor examination is WNL with the exception of xerostomia. Recommend the following pharyngeal swallowing exercises: effortful swallow, Masako, Mendelsohn, and chin tuck against resistance with implementation of  swallow. Previous imaging and reports were reviewed with Pt. All exercises were demonstrated and Pt able to implement and return demonstrate. Pt is scheduled for MBSS in April. Plan for swallow therapy 1-2x/month for 2 months. She was encouraged to keep her scheduled MBSS.   OBJECTIVE IMPAIRMENTS: include dysphagia. These impairments are limiting patient from safety when swallowing. Factors affecting potential to achieve goals and functional outcome are previous level of function. Patient will benefit from skilled SLP services to address above impairments and improve overall function.  REHAB POTENTIAL: Excellent   GOALS: Goals reviewed with patient? Yes  SHORT TERM GOALS: Target date: 04/13/2023  Pt will complete pharyngeal swallowing exercises including, but not limited to: masako, effortful swallow, Mendelsohn, lingual press, chin tuck against resistance (CTAR), and laryngeal closure with initial model provided by SLP and daily completion of 3x/day per Pt self report Baseline: min assist, introduced today Goal status: ONGOING  2.  Pt will independently adhere to aspiration precautions/compensatory strategies for consumption of least restrictive PO to avoid/reduce clinical s/sx aspiration, across 2-3 SLP sessions Baseline: Introduced today Goal status: ONGOING  3.  Pt will keep a "swallow journal" to record any changes and also times needed to manually sweep left side to clear a pill etc. Baseline: Introduced today Goal status: ONGOING  LONG TERM GOALS: Same as short term goals; This patient will improve oropharyngeal swallowing function to resume the least restrictive diet possible and/or alleviate symptoms of dysphagia.    PLAN:  SLP FREQUENCY:  1-2x/month before MBSS in April  SLP DURATION: other: 2 months  PLANNED INTERVENTIONS: Aspiration precaution training, Pharyngeal strengthening exercises, SLP instruction and feedback, Compensatory strategies, Patient/family education, and  [id number] Treatment of swallowing function   Thank you,  Havery Moros, CCC-SLP [phone]  Christan Defranco, CCC-SLP 03/01/2023, 12:05 PM

## 2023-03-31 ENCOUNTER — Encounter (HOSPITAL_COMMUNITY): Payer: Self-pay | Admitting: Speech Pathology

## 2023-04-14 ENCOUNTER — Ambulatory Visit (INDEPENDENT_AMBULATORY_CARE_PROVIDER_SITE_OTHER)

## 2023-04-14 ENCOUNTER — Ambulatory Visit: Admitting: Podiatry

## 2023-04-14 ENCOUNTER — Encounter: Payer: Self-pay | Admitting: Podiatry

## 2023-04-14 DIAGNOSIS — M79672 Pain in left foot: Secondary | ICD-10-CM | POA: Diagnosis not present

## 2023-04-14 DIAGNOSIS — M205X2 Other deformities of toe(s) (acquired), left foot: Secondary | ICD-10-CM

## 2023-04-14 DIAGNOSIS — M7752 Other enthesopathy of left foot: Secondary | ICD-10-CM | POA: Diagnosis not present

## 2023-04-14 MED ORDER — BETAMETHASONE SOD PHOS & ACET 6 (3-3) MG/ML IJ SUSP
3.0000 mg | Freq: Once | INTRAMUSCULAR | Status: AC
Start: 2023-04-14 — End: 2023-04-14
  Administered 2023-04-14: 3 mg via INTRA_ARTICULAR

## 2023-04-14 NOTE — Progress Notes (Signed)
   Chief Complaint  Patient presents with   Toe Pain    RM6: pain in L foot 1st toe, tried everything nothing is helping, injections only temporary joint pain effecting daily walking    HPI: 63 y.o. female presenting today for new complaint of pain regarding the left great toe.  Onset about 6-8 weeks ago.  Idiopathic.  She denies a history of injury.  Gradual onset.  She has not anything for treatment specifically for the great toe but about 3 weeks ago she did take a prednisone pack for sciatica which helped the great toe minimally.  History reviewed. No pertinent past medical history.  History reviewed. No pertinent surgical history.  Allergies  Allergen Reactions   Compazine [Prochlorperazine Edisylate] Nausea Only   Pregabalin     Other Reaction(s): drowsy/foggy   Tylox [Oxycodone-Acetaminophen] Itching   Tyloxapol Other (See Comments)     Physical Exam: General: The patient is alert and oriented x3 in no acute distress.  Dermatology: Skin is warm, dry and supple bilateral lower extremities.   Vascular: Palpable pedal pulses bilaterally. Capillary refill within normal limits.  No appreciable edema.  No erythema.  Neurological: Grossly intact via light touch  Musculoskeletal Exam: Tenderness with palpation right motion noted to the first MTP of the left foot.  There is limited range of motion as well consistent with hallux limitus  Radiographic Exam LT foot 04/14/2023:  Normal osseous mineralization.  Joint spaces are mostly preserved to the first MTP of the left foot.  No obvious indication of erosions or arthritic degenerative changes  Assessment/Plan of Care: 1.  First MTP capsulitis/hallux limitus left 2.  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  X-rays reviewed -Injection 0.5 cc Celestone Soluspan injected the first MTP left foot -Patient currently takes diclofenac 75 mg twice daily.  Continue as prescribed -Advised against going barefoot.   Recommend good supportive tennis shoes that provide plenty of support and cushion and limit the range of motion of the forefoot -Return to clinic as needed      Felecia Shelling, DPM [facility]  Dr. Felecia Shelling, DPM    [address] [place], [place] [address]                Office [phone]  Fax [phone]

## 2023-08-11 ENCOUNTER — Other Ambulatory Visit (HOSPITAL_BASED_OUTPATIENT_CLINIC_OR_DEPARTMENT_OTHER): Payer: Self-pay | Admitting: Rheumatology

## 2023-08-11 DIAGNOSIS — M8580 Other specified disorders of bone density and structure, unspecified site: Secondary | ICD-10-CM

## 2023-08-23 ENCOUNTER — Other Ambulatory Visit: Payer: Self-pay | Admitting: Obstetrics & Gynecology

## 2023-08-23 DIAGNOSIS — Z1231 Encounter for screening mammogram for malignant neoplasm of breast: Secondary | ICD-10-CM

## 2023-12-24 ENCOUNTER — Other Ambulatory Visit (HOSPITAL_COMMUNITY): Payer: Self-pay | Admitting: Internal Medicine

## 2023-12-24 DIAGNOSIS — R0789 Other chest pain: Secondary | ICD-10-CM

## 2023-12-27 ENCOUNTER — Ambulatory Visit (HOSPITAL_COMMUNITY): Admission: RE | Admit: 2023-12-27 | Discharge: 2023-12-27 | Attending: Cardiology

## 2023-12-27 ENCOUNTER — Ambulatory Visit
Admission: RE | Admit: 2023-12-27 | Discharge: 2023-12-27 | Disposition: A | Source: Ambulatory Visit | Attending: Obstetrics & Gynecology | Admitting: Obstetrics & Gynecology

## 2023-12-27 DIAGNOSIS — R0789 Other chest pain: Secondary | ICD-10-CM | POA: Insufficient documentation

## 2023-12-27 DIAGNOSIS — Z1231 Encounter for screening mammogram for malignant neoplasm of breast: Secondary | ICD-10-CM

## 2023-12-27 LAB — ECHOCARDIOGRAM COMPLETE
Area-P 1/2: 3.4 cm2
S' Lateral: 2.85 cm

## 2023-12-28 ENCOUNTER — Telehealth (HOSPITAL_BASED_OUTPATIENT_CLINIC_OR_DEPARTMENT_OTHER): Payer: Self-pay

## 2023-12-28 NOTE — Telephone Encounter (Signed)
° °  Name: Laura Tapia  DOB: 1960/02/25  MRN: [id number]  Primary Cardiologist: None  Chart reviewed as part of pre-operative protocol coverage. Because of Laura Tapia's past medical history and time since last visit, she will require a follow-up in-office visit in order to better assess preoperative cardiovascular risk.  Pre-op covering staff: - Please schedule appointment and call patient to inform them. If patient already had an upcoming appointment within acceptable timeframe, please add pre-op clearance to the appointment notes so provider is aware. - Please contact requesting surgeon's office via preferred method (i.e, phone, fax) to inform them of need for appointment prior to surgery.   Orren LOISE Fabry, PA-C  12/28/2023, 10:44 AM

## 2023-12-28 NOTE — Telephone Encounter (Signed)
 I will update the requesting office pt has new pt appt with Damien Braver, NP 12/30/23. Pt coming in per appt notes atypical chest pain.

## 2023-12-28 NOTE — Telephone Encounter (Signed)
° °  Pre-operative Risk Assessment    Patient Name: Laura Tapia  DOB: 1960/02/19 MRN: [id number]   Date of last office visit: Never had a visit at our office Date of next office visit: None  Request for Surgical Clearance    Procedure:  Revision rotator cuff tear rt shoulder  Date of Surgery:  Clearance 01/03/24                                 Surgeon:  Dr. Reyes Billing Surgeon's Group or Practice Name:  [facility] Phone number:  [phone] Fax number:  [phone] or [phone] - Katheryn Dustman   Type of Clearance Requested:   - Medical    Type of Anesthesia:  Does not specify   Additional requests/questions:  None  SignedPatrcia Iverson LITTIE   12/28/2023, 9:50 AM

## 2023-12-30 ENCOUNTER — Encounter: Payer: Self-pay | Admitting: Nurse Practitioner

## 2023-12-30 ENCOUNTER — Ambulatory Visit: Attending: Cardiovascular Disease | Admitting: Nurse Practitioner

## 2023-12-30 VITALS — BP 130/82 | HR 88 | Ht 66.0 in | Wt 171.0 lb

## 2023-12-30 DIAGNOSIS — E782 Mixed hyperlipidemia: Secondary | ICD-10-CM | POA: Diagnosis not present

## 2023-12-30 DIAGNOSIS — R9431 Abnormal electrocardiogram [ECG] [EKG]: Secondary | ICD-10-CM | POA: Diagnosis not present

## 2023-12-30 DIAGNOSIS — I10 Essential (primary) hypertension: Secondary | ICD-10-CM

## 2023-12-30 DIAGNOSIS — R0989 Other specified symptoms and signs involving the circulatory and respiratory systems: Secondary | ICD-10-CM

## 2023-12-30 DIAGNOSIS — R072 Precordial pain: Secondary | ICD-10-CM

## 2023-12-30 MED ORDER — METOPROLOL TARTRATE 100 MG PO TABS: 100.0000 mg | ORAL_TABLET | Freq: Once | ORAL | 0 refills | Status: AC

## 2023-12-30 NOTE — Patient Instructions (Addendum)
 Medication Instructions:  Your physician recommends that you continue on your current medications as directed. Please refer to the Current Medication list given to you today.  *If you need a refill on your cardiac medications before your next appointment, please call your pharmacy*  Lab Work: TODAY:  BMET  If you have labs (blood work) drawn today and your tests are completely normal, you will receive your results only by: MyChart Message (if you have MyChart) OR A paper copy in the mail If you have any lab test that is abnormal or we need to change your treatment, we will call you to review the results.  Testing/Procedures: Your physician has requested that you have a carotid duplex. This test is an ultrasound of the carotid arteries in your neck. It looks at blood flow through these arteries that supply the brain with blood. Allow one hour for this exam. There are no restrictions or special instructions.     Your physician has requested that you have cardiac CT. Cardiac computed tomography (CT) is a painless test that uses an x-ray machine to take clear, detailed pictures of your heart. For further information please visit [email or web address] Please follow instruction sheet BELOW:    Your cardiac CT will be scheduled at one of the below locations:   [facility] [address] [place], [place] [address] [phone] (Severe contrast allergies only)  OR   [facility] [address] [place], [place] [address] [phone]  OR   [facility] [address] [place], [place] [address] [phone]  OR   [facility] and Vascular Tower [address]  [place], [place] [address]  OR   [facility] [address] [place], [place] [phone]  If scheduled at [facility], please arrive at the [facility] and Children's Entrance (Entrance C2) of [facility] 30 minutes prior to test start  time. You can use the FREE valet parking offered at entrance C (encouraged to control the heart rate for the test)  Proceed to the [facility] Radiology Department (first floor) to check-in and test prep.  All radiology patients and guests should use entrance C2 at [facility], accessed from [facility], even though the hospital's physical address listed is [address]  If scheduled at the Heart and Vascular Tower at [address], please enter the parking lot using the [address] entrance and use the FREE valet service at the patient drop-off area. Enter the building and check-in with registration on the main floor.  If scheduled at [facility], please arrive to the Heart and Vascular Center 15 mins early for check-in and test prep.  There is spacious parking and easy access to the radiology department from [facility]. Please enter here and check-in with the desk attendant.   If scheduled at [facility], please arrive 30 minutes early for check-in and test prep.  Please follow these instructions carefully (unless otherwise directed):  An IV will be required for this test and Nitroglycerin will be given.  Hold all erectile dysfunction medications at least 3 days (72 hrs) prior to test. (Ie viagra, cialis, sildenafil, tadalafil, etc)   On the Night Before the Test: Be sure to Drink plenty of water. Do not consume any caffeinated/decaffeinated beverages or chocolate 12 hours prior to your test. Do not take any antihistamines 12 hours prior to your test.    On  the Day of the Test: Drink plenty of water until 1 hour prior to the test. Do not eat any food 1 hour prior to test. HOLD THE METOPROLOL  25 MG THE MORNING OF THE CT  Take metoprolol  (Lopressor ) 100 MG  two hours prior to test. THIS HAS BEEN SENT TO [facility] If you take Furosemide/Hydrochlorothiazide/Spironolactone/Chlorthalidone,  please HOLD on the morning of the test. Patients who wear a continuous glucose monitor MUST remove the device prior to scanning. FEMALES- please wear underwire-free bra if available, avoid dresses & tight clothing        After the Test: Drink plenty of water. After receiving IV contrast, you may experience a mild flushed feeling. This is normal. On occasion, you may experience a mild rash up to 24 hours after the test. This is not dangerous. If this occurs, you can take Benadryl 25 mg, Zyrtec, Claritin, or Allegra and increase your fluid intake. (Patients taking Tikosyn should avoid Benadryl, and may take Zyrtec, Claritin, or Allegra) If you experience trouble breathing, this can be serious. If it is severe call 911 IMMEDIATELY. If it is mild, please call our office.  We will call to schedule your test 2-4 weeks out understanding that some insurance companies will need an authorization prior to the service being performed.   For more information and frequently asked questions, please visit our website : [email or web address]  For non-scheduling related questions, please contact the cardiac imaging nurse navigator should you have any questions/concerns: Cardiac Imaging Nurse Navigators Direct Office Dial: [phone]   For scheduling needs, including cancellations and rescheduling, please call Brittany, [phone].   Follow-Up: At [facility], you and your health needs are our priority.  As part of our continuing mission to provide you with exceptional heart care, our providers are all part of one team.  This team includes your primary Cardiologist (physician) and Advanced Practice Providers or APPs (Physician Assistants and Nurse Practitioners) who all work together to provide you with the care you need, when you need it.  Your next appointment:   6 -8 week(s)  Provider:   Oneil Parchment, MD    We recommend signing up for the patient portal called MyChart.   Sign up information is provided on this After Visit Summary.  MyChart is used to connect with patients for Virtual Visits (Telemedicine).  Patients are able to view lab/test results, encounter notes, upcoming appointments, etc.  Non-urgent messages can be sent to your provider as well.   To learn more about what you can do with MyChart, go to [email or web address].   Other Instructions

## 2023-12-30 NOTE — Progress Notes (Addendum)
 "  Office Visit    Patient Name: MAZEY MANTELL Date of Encounter: 12/30/2023  Primary Care Provider:  Dwight Trula SQUIBB, MD Primary Cardiologist:  Oneil Parchment, MD  Chief Complaint    63 year old female with a history of hypertension, hyperlipidemia, hypothyroidism, rheumatoid arthritis, osteopenia, DDD, migraines, insomnia, and GERD who presents for her first clinic new patient evaluation.  Past Medical History    No past medical history on file. No past surgical history on file.  Allergies  Allergies[1]   Labs/Other Studies Reviewed    The following studies were reviewed today:  Cardiac Studies & Procedures   ______________________________________________________________________________________________     ECHOCARDIOGRAM  ECHOCARDIOGRAM COMPLETE 12/27/2023  Narrative ECHOCARDIOGRAM REPORT    Patient Name:   ACACIA LATORRE Date of Exam: 12/27/2023 Medical Rec #:  [id number]      Height:       66.0 in Accession #:    [id number]     Weight:       157.0 lb Date of Birth:  26-Mar-1960     BSA:          1.805 m Patient Age:    62 years       BP:           156/86 mmHg Patient Gender: F              HR:           63 bpm. Exam Location:  [address]  Procedure: 2D Echo, 3D Echo and Strain Analysis (Both Spectral and Color Flow Doppler were utilized during procedure).  Indications:    R07.89 Other chest pain  History:        Patient has no prior history of Echocardiogram examinations. Risk Factors:Dyslipidemia. Hypothyroidism.  Sonographer:    Jon Hacker RCS Referring Phys: [id number] SNEHA P RAJU  IMPRESSIONS   1. Left ventricular ejection fraction, by estimation, is 55 to 60%. Left ventricular ejection fraction by 3D volume is 56 %. The left ventricle has normal function. The left ventricle has no regional wall motion abnormalities. Left ventricular diastolic parameters were normal. The average left ventricular global longitudinal strain is -19.4 %. The global  longitudinal strain is normal. 2. Right ventricular systolic function is normal. The right ventricular size is normal. There is normal pulmonary artery systolic pressure. 3. The mitral valve is normal in structure. Trivial mitral valve regurgitation. No evidence of mitral stenosis. 4. The aortic valve is tricuspid. There is mild calcification of the aortic valve. Aortic valve regurgitation is not visualized. No aortic stenosis is present. 5. The inferior vena cava is normal in size with greater than 50% respiratory variability, suggesting right atrial pressure of 3 mmHg.  FINDINGS Left Ventricle: Left ventricular ejection fraction, by estimation, is 55 to 60%. Left ventricular ejection fraction by 3D volume is 56 %. The left ventricle has normal function. The left ventricle has no regional wall motion abnormalities. The average left ventricular global longitudinal strain is -19.4 %. Strain was performed and the global longitudinal strain is normal. The left ventricular internal cavity size was normal in size. There is no left ventricular hypertrophy. Left ventricular diastolic parameters were normal. Normal left ventricular filling pressure.  Right Ventricle: The right ventricular size is normal. No increase in right ventricular wall thickness. Right ventricular systolic function is normal. There is normal pulmonary artery systolic pressure. The tricuspid regurgitant velocity is 1.41 m/s, and with an assumed right atrial pressure of 3 mmHg, the estimated right ventricular systolic  pressure is 11.0 mmHg.  Left Atrium: Left atrial size was normal in size.  Right Atrium: Right atrial size was normal in size.  Pericardium: There is no evidence of pericardial effusion.  Mitral Valve: The mitral valve is normal in structure. Trivial mitral valve regurgitation. No evidence of mitral valve stenosis.  Tricuspid Valve: The tricuspid valve is normal in structure. Tricuspid valve regurgitation is trivial.  No evidence of tricuspid stenosis.  Aortic Valve: The aortic valve is tricuspid. There is mild calcification of the aortic valve. Aortic valve regurgitation is not visualized. No aortic stenosis is present.  Pulmonic Valve: The pulmonic valve was normal in structure. Pulmonic valve regurgitation is not visualized. No evidence of pulmonic stenosis.  Aorta: The aortic root is normal in size and structure.  Venous: The inferior vena cava is normal in size with greater than 50% respiratory variability, suggesting right atrial pressure of 3 mmHg.  IAS/Shunts: No atrial level shunt detected by color flow Doppler.  Additional Comments: 3D was performed not requiring image post processing on an independent workstation and was normal.   LEFT VENTRICLE PLAX 2D LVIDd:         4.42 cm         Diastology LVIDs:         2.85 cm         LV e' medial:    9.79 cm/s LV PW:         0.97 cm         LV E/e' medial:  8.1 LV IVS:        1.04 cm         LV e' lateral:   15.40 cm/s LVOT diam:     2.00 cm         LV E/e' lateral: 5.1 LV SV:         78 LV SV Index:   43              2D Longitudinal LVOT Area:     3.14 cm        Strain 2D Strain GLS   -18.4 % (A4C): 2D Strain GLS   -21.5 % (A3C): 2D Strain GLS   -18.4 % (A2C): 2D Strain GLS   -19.4 % Avg:  3D Volume EF LV 3D EF:    Left ventricul ar ejection fraction by 3D volume is 56 %.  3D Volume EF: 3D EF:        56 % LV EDV:       130 ml LV ESV:       58 ml LV SV:        72 ml  RIGHT VENTRICLE RV Basal diam:  2.35 cm RV S prime:     11.20 cm/s TAPSE (M-mode): 1.8 cm  LEFT ATRIUM           Index        RIGHT ATRIUM           Index LA diam:      3.30 cm 1.83 cm/m   RA Area:     11.40 cm LA Vol (A2C): 43.4 ml 24.05 ml/m  RA Volume:   24.50 ml  13.58 ml/m LA Vol (A4C): 50.0 ml 27.71 ml/m AORTIC VALVE LVOT Vmax:   114.00 cm/s LVOT Vmean:  76.500 cm/s LVOT VTI:    0.249 m  AORTA Ao Root diam: 3.30 cm Ao Asc diam:  3.30  cm  MITRAL VALVE  TRICUSPID VALVE MV Area (PHT): 3.40 cm    TR Peak grad:   8.0 mmHg MV Decel Time: 223 msec    TR Vmax:        141.00 cm/s MV E velocity: 79.10 cm/s MV A velocity: 71.80 cm/s  SHUNTS MV E/A ratio:  1.10        Systemic VTI:  0.25 m Systemic Diam: 2.00 cm  Annabella Scarce MD Electronically signed by Annabella Scarce MD Signature Date/Time: 12/27/2023/2:15:57 PM    Final          ______________________________________________________________________________________________     Recent Labs: No results found for requested labs within last 365 days.  Recent Lipid Panel No results found for: CHOL, TRIG, HDL, CHOLHDL, VLDL, LDLCALC, LDLDIRECT  History of Present Illness    63 year old female with the above past medical history including hypertension, hyperlipidemia, hypothyroidism, rheumatoid arthritis, osteopenia, DDD, migraines, insomnia, and GERD.  She was referred to cardiology in the setting of nonexertional chest pain, abnormal EKG.  She saw her PCP on 12/24/2023 and reported an episode of nonexertional chest discomfort.  Troponin was negative.  EKG showed new T wave inversions. Echocardiogram in 12/2023 showed EF 55 to 60%, normal LV function, no RWMA, normal RV systolic function, no significant valvular abnormalities.  She presents today for her first clinic new patient evaluation accompanied by her husband, and for preoperative cardiac evaluation for revision of rotator cuff tear to the right shoulder scheduled for 01/03/2024 with Dr. Reyes Freshwater of [facility].  Her husband is a patient of Dr. Jeffrie.  She shares that approximately 1 month ago she woke up early in the morning with midsternal chest pressure.  She took Pepto-Bismol and sat up in her recliner.  Her symptoms lasted 30 to 40 minutes and then resolved.  She had another episode of chest pressure while pumping gas last week.  Symptoms lasted approximate 30 to 45 minutes.   She took 4 baby aspirin.  She considered ED evaluation, however, her symptoms eventually resolved. She does have a history of acid reflux, however, she states that these symptoms were different than her typical reflux symptoms.  She denies any significant family history of premature CAD.  Her father does have a history of atrial fibrillation.  She denies any exertional symptoms, denies palpitations, dizziness, dyspnea, edema, PND, with apnea, weight gain.  She is a retired CLINICAL BIOCHEMIST.  She is married has 2 children and 5 grandchildren.  She drinks caffeine, 3-4 diet Pepsi's a day.  Rare alcohol use, no drug use or tobacco use.  She is aware that her recent symptoms may cause a delay in her shoulder surgery.  Home Medications    Current Outpatient Medications  Medication Sig Dispense Refill   Adalimumab (HUMIRA PEN) 40 MG/0.4ML PNKT Humira(CF) Pen 40 mg/0.4 mL subcutaneous kit     brompheniramine-pseudoephedrine-DM 30-2-10 MG/5ML syrup Take 5 mLs by mouth 4 (four) times daily as needed. 140 mL 0   butalbital-acetaminophen -caffeine (FIORICET, ESGIC) 50-325-40 MG tablet butalbital-acetaminophen -caffeine 50 mg-325 mg-40 mg tablet  TAKE 1 TO 2 TABLETS ORALLY EVERY 4 TO 6 HOURS AS NEEDED FOR PAIN.     calcium carbonate (OS-CAL) 600 MG TABS tablet Take 600 mg by mouth 2 (two) times daily with a meal.     cetirizine (ZYRTEC) 10 MG tablet Take 10 mg by mouth daily.     cholecalciferol (VITAMIN D) 1000 UNITS tablet Take 1,000 Units by mouth daily.     cyclobenzaprine (FLEXERIL) 10 MG tablet cyclobenzaprine 10 mg tablet  Estradiol (YUVAFEM) 10 MCG TABS vaginal tablet Yuvafem 10 mcg vaginal tablet     estradiol-norethindrone (ACTIVELLA) 1-0.5 MG per tablet Take 1 tablet by mouth daily.     fluconazole (DIFLUCAN) 150 MG tablet      fluticasone (FLONASE) 50 MCG/ACT nasal spray fluticasone propionate 50 mcg/actuation nasal spray,suspension     gabapentin (NEURONTIN) 100 MG capsule Take 100 mg by mouth 2 (two) times  daily.     HYDROcodone-acetaminophen  (NORCO) 10-325 MG tablet Take 1 tablet by mouth 2 (two) times daily as needed.     hydroxychloroquine (PLAQUENIL) 200 MG tablet Take by mouth daily.     levothyroxine (SYNTHROID, LEVOTHROID) 100 MCG tablet Take 100 mcg by mouth daily before breakfast.     meloxicam (MOBIC) 15 MG tablet      metoprolol  succinate (TOPROL -XL) 25 MG 24 hr tablet Take 25 mg by mouth daily.     metoprolol  tartrate (LOPRESSOR ) 100 MG tablet Take 1 tablet (100 mg total) by mouth once. Take 90-120 minutes prior to scan. Hold for SBP less than 110. 1 tablet 0   MULTIPLE VITAMIN PO Take by mouth.     omeprazole (PRILOSEC) 40 MG capsule Take 40 mg by mouth daily.     promethazine (PHENERGAN) 25 MG tablet promethazine 25 mg tablet     rosuvastatin (CRESTOR) 10 MG tablet Take 10 mg by mouth daily. Pt only takes Monday - Friday     topiramate (TOPAMAX) 100 MG tablet Take 100 mg by mouth 2 (two) times daily.     valACYclovir (VALTREX) 500 MG tablet valacyclovir 500 mg tablet  TAKE (1) TABLET BY MOUTH ONCE DAILY.     zolmitriptan (ZOMIG) 5 MG tablet Take 5 mg by mouth as needed for migraine.     zolpidem (AMBIEN) 10 MG tablet zolpidem 10 mg tablet     No current facility-administered medications for this visit.     Review of Systems   She denies palpitations, dyspnea, pnd, orthopnea, n, v, dizziness, syncope, edema, weight gain, or early satiety. All other systems reviewed and are otherwise negative except as noted above.   Physical Exam    VS:  BP 130/82   Pulse 88   Ht 5' 6 (1.676 m)   Wt 171 lb (77.6 kg)   SpO2 98%   BMI 27.60 kg/m 36 GEN: Well nourished, well developed, in no acute distress. HEENT: normal. Neck: Supple, no JVD, carotid bruits, or masses. Cardiac: RRR, no murmurs, rubs, o and r gallops. No clubbing, cyanosis, edema.  Radials/DP/PT 2+ and equal bilaterally.  Respiratory:  Respirations regular and unlabored, clear to auscultation bilaterally. GI: Soft,  nontender, nondistended, BS + x 4. MS: no deformity or atrophy. Skin: warm and dry, no rash. Neuro:  Strength and sensation are intact. Psych: Normal affect.  Accessory Clinical Findings    ECG personally reviewed by me today -    - no EKG in office today.   No results found for: WBC, HGB, HCT, MCV, PLT No results found for: CREATININE, BUN, NA, K, CL, CO2 No results found for: ALT, AST, GGT, ALKPHOS, BILITOT No results found for: CHOL, HDL, LDLCALC, LDLDIRECT, TRIG, CHOLHDL  No results found for: HGBA1C  Assessment & Plan    1. Chest pain/abnormal EKG: Pt reports two recent episodes of nonexertional midsternal chest pressure. Recent EKG with T wave inversion (no prior EKG available for comparison). Troponin was negative per PCP. Echocardiogram in 12/2023 showed EF 55 to 60%, normal LV function, no RWMA, normal  RV systolic function, no significant valvular abnormalities. Through shared decision making, will pursue coronary CT angiogram.  Will update BMET today.  Recent echo reassuring.  If CT unremarkable, she will be cleared for surgery as below.  Reviewed ED precautions.  2. R carotid bruit: Noted on exam today. Patient states she has a history of right carotid bruit and that previous testing was normal.  Will repeat carotid Dopplers for monitoring.  3. Hypertension: BP well controlled. Continue current antihypertensive regimen.   4. Hyperlipidemia: LDL was 133 in 04/2023.  Consider escalation of statin therapy pending CT results.  Continue Crestor.  5. Preoperative cardiac exam: According to the Revised Cardiac Risk Index (RCRI), her Perioperative Risk of Major Cardiac Event is (%): 0.4 .  Able to complete greater than 4 METS at baseline.  However, given recent symptoms of chest discomfort, we will pursue coronary CT angiogram as above.  If testing negative for obstructive disease, she will be cleared for surgery. Patient understands this will  delay her procedure and she is agreeable to proceed.  I will notify the requesting party.  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 Therefore, based on ACC/AHA guidelines, patient would be at acceptable risk for the planned procedure without further cardiovascular testing. I will route this recommendation to the requesting party via Epic fax function.   6. Disposition: Follow-up in 6 to 8 weeks with Dr. Jeffrie or APP.      Damien JAYSON Braver, NP 12/30/2023, 4:54 PM       [1]  Allergies Allergen Reactions   Compazine [Prochlorperazine Edisylate] Nausea Only   Pregabalin     Other Reaction(s): drowsy/foggy   Tylox [Oxycodone -Acetaminophen ] Itching   Tyloxapol Other (See Comments)   "

## 2023-12-31 ENCOUNTER — Other Ambulatory Visit: Payer: Self-pay | Admitting: Internal Medicine

## 2023-12-31 DIAGNOSIS — R928 Other abnormal and inconclusive findings on diagnostic imaging of breast: Secondary | ICD-10-CM

## 2023-12-31 DIAGNOSIS — N6489 Other specified disorders of breast: Secondary | ICD-10-CM

## 2023-12-31 LAB — BASIC METABOLIC PANEL WITH GFR
BUN/Creatinine Ratio: 23 (ref 12–28)
BUN: 16 mg/dL (ref 8–27)
CO2: 18 mmol/L — ABNORMAL LOW (ref 20–29)
Calcium: 9.4 mg/dL (ref 8.7–10.3)
Chloride: 109 mmol/L — ABNORMAL HIGH (ref 96–106)
Creatinine, Ser: 0.71 mg/dL (ref 0.57–1.00)
Glucose: 89 mg/dL (ref 70–99)
Potassium: 4.2 mmol/L (ref 3.5–5.2)
Sodium: 142 mmol/L (ref 134–144)
eGFR: 95 mL/min/1.73

## 2024-01-03 ENCOUNTER — Ambulatory Visit (HOSPITAL_COMMUNITY)
Admission: RE | Admit: 2024-01-03 | Discharge: 2024-01-03 | Disposition: A | Discharge status: Home / Self Care | Source: Ambulatory Visit | Attending: Nurse Practitioner | Admitting: Nurse Practitioner

## 2024-01-03 ENCOUNTER — Ambulatory Visit (HOSPITAL_COMMUNITY)

## 2024-01-03 ENCOUNTER — Ambulatory Visit: Payer: Self-pay | Admitting: Nurse Practitioner

## 2024-01-03 DIAGNOSIS — R0989 Other specified symptoms and signs involving the circulatory and respiratory systems: Secondary | ICD-10-CM | POA: Insufficient documentation

## 2024-01-03 DIAGNOSIS — R072 Precordial pain: Secondary | ICD-10-CM | POA: Insufficient documentation

## 2024-01-03 DIAGNOSIS — R9431 Abnormal electrocardiogram [ECG] [EKG]: Secondary | ICD-10-CM | POA: Insufficient documentation

## 2024-01-03 MED ORDER — IOHEXOL 350 MG/ML SOLN
100.0000 mL | Freq: Once | INTRAVENOUS | Status: AC | PRN
  Administered 2024-01-03: 100 mL via INTRAVENOUS

## 2024-01-03 MED ORDER — NITROGLYCERIN 0.4 MG SL SUBL
0.8000 mg | SUBLINGUAL_TABLET | Freq: Once | SUBLINGUAL | Status: AC
  Administered 2024-01-03: 0.8 mg via SUBLINGUAL

## 2024-01-07 ENCOUNTER — Other Ambulatory Visit: Payer: Self-pay | Admitting: Obstetrics & Gynecology

## 2024-01-07 DIAGNOSIS — N6489 Other specified disorders of breast: Secondary | ICD-10-CM

## 2024-01-07 DIAGNOSIS — R928 Other abnormal and inconclusive findings on diagnostic imaging of breast: Secondary | ICD-10-CM

## 2024-01-10 ENCOUNTER — Ambulatory Visit (HOSPITAL_COMMUNITY)

## 2024-01-10 ENCOUNTER — Other Ambulatory Visit: Payer: Self-pay

## 2024-01-10 DIAGNOSIS — E782 Mixed hyperlipidemia: Secondary | ICD-10-CM

## 2024-01-10 DIAGNOSIS — Z79899 Other long term (current) drug therapy: Secondary | ICD-10-CM

## 2024-01-10 DIAGNOSIS — I10 Essential (primary) hypertension: Secondary | ICD-10-CM

## 2024-01-10 MED ORDER — ROSUVASTATIN CALCIUM 20 MG PO TABS: 20.0000 mg | ORAL_TABLET | Freq: Every day | ORAL | 3 refills | Status: AC

## 2024-01-11 ENCOUNTER — Ambulatory Visit
Admission: RE | Admit: 2024-01-11 | Discharge: 2024-01-11 | Disposition: A | Discharge status: Home / Self Care | Source: Ambulatory Visit | Attending: Internal Medicine | Admitting: Internal Medicine

## 2024-01-11 DIAGNOSIS — N6489 Other specified disorders of breast: Secondary | ICD-10-CM

## 2024-01-11 DIAGNOSIS — R928 Other abnormal and inconclusive findings on diagnostic imaging of breast: Secondary | ICD-10-CM

## 2024-01-18 ENCOUNTER — Other Ambulatory Visit

## 2024-01-18 ENCOUNTER — Encounter

## 2024-01-21 ENCOUNTER — Ambulatory Visit: Admitting: Cardiology

## 2024-02-16 LAB — HEPATIC FUNCTION PANEL
ALT: 34 [IU]/L — ABNORMAL HIGH (ref 0–32)
AST: 30 [IU]/L (ref 0–40)
Albumin: 4.3 g/dL (ref 3.9–4.9)
Alkaline Phosphatase: 47 [IU]/L — ABNORMAL LOW (ref 49–135)
Bilirubin Total: 0.2 mg/dL (ref 0.0–1.2)
Bilirubin, Direct: 0.09 mg/dL (ref 0.00–0.40)
Total Protein: 6.7 g/dL (ref 6.0–8.5)

## 2024-02-16 LAB — LIPID PANEL
Chol/HDL Ratio: 2.3 ratio (ref 0.0–4.4)
Cholesterol, Total: 166 mg/dL (ref 100–199)
HDL: 72 mg/dL
LDL Chol Calc (NIH): 78 mg/dL (ref 0–99)
Triglycerides: 89 mg/dL (ref 0–149)
VLDL Cholesterol Cal: 16 mg/dL (ref 5–40)

## 2024-02-22 ENCOUNTER — Ambulatory Visit: Payer: Self-pay | Admitting: Cardiology

## 2024-03-30 ENCOUNTER — Other Ambulatory Visit (HOSPITAL_BASED_OUTPATIENT_CLINIC_OR_DEPARTMENT_OTHER)
